# Patient Record
Sex: Male | Born: 1940 | Race: White | Hispanic: No | Marital: Married | Smoking: Never smoker
Health system: Southern US, Community
[De-identification: ages and names within clinical notes are randomized; demographics above are authoritative.]

## PROBLEM LIST (undated history)

## (undated) DIAGNOSIS — I10 Essential (primary) hypertension: Secondary | ICD-10-CM

## (undated) DIAGNOSIS — F0390 Unspecified dementia without behavioral disturbance: Secondary | ICD-10-CM

## (undated) HISTORY — PX: OTHER SURGICAL HISTORY: SHX169

---

## 2005-08-05 ENCOUNTER — Encounter: Payer: Self-pay | Admitting: Internal Medicine

## 2005-08-25 ENCOUNTER — Encounter: Payer: Self-pay | Admitting: Internal Medicine

## 2005-09-03 ENCOUNTER — Ambulatory Visit: Payer: Self-pay | Admitting: Internal Medicine

## 2005-09-18 ENCOUNTER — Encounter: Payer: Self-pay | Admitting: Internal Medicine

## 2005-10-06 ENCOUNTER — Ambulatory Visit: Payer: Self-pay | Admitting: Pain Medicine

## 2006-09-22 ENCOUNTER — Ambulatory Visit: Payer: Self-pay | Admitting: Internal Medicine

## 2007-02-03 ENCOUNTER — Ambulatory Visit: Payer: Self-pay | Admitting: Unknown Physician Specialty

## 2007-06-18 ENCOUNTER — Ambulatory Visit (HOSPITAL_COMMUNITY): Admission: RE | Admit: 2007-06-18 | Discharge: 2007-06-18 | Payer: Self-pay | Admitting: Neurology

## 2007-08-09 ENCOUNTER — Ambulatory Visit: Payer: Self-pay | Admitting: Psychology

## 2007-09-16 ENCOUNTER — Encounter: Payer: Self-pay | Admitting: Neurology

## 2007-09-19 ENCOUNTER — Encounter: Payer: Self-pay | Admitting: Neurology

## 2007-10-20 ENCOUNTER — Encounter: Payer: Self-pay | Admitting: Neurology

## 2009-05-01 ENCOUNTER — Ambulatory Visit: Payer: Self-pay | Admitting: General Practice

## 2009-06-29 ENCOUNTER — Ambulatory Visit: Payer: Self-pay | Admitting: Internal Medicine

## 2009-06-29 ENCOUNTER — Ambulatory Visit: Payer: Self-pay | Admitting: General Practice

## 2009-07-16 ENCOUNTER — Ambulatory Visit: Payer: Self-pay | Admitting: General Practice

## 2012-01-06 ENCOUNTER — Ambulatory Visit: Payer: Self-pay | Admitting: Cardiology

## 2012-01-07 LAB — BASIC METABOLIC PANEL
Calcium, Total: 8.7 mg/dL (ref 8.5–10.1)
Chloride: 107 mmol/L (ref 98–107)
EGFR (African American): >60
Glucose: 96 mg/dL (ref 65–99)
Sodium: 141 mmol/L (ref 136–145)

## 2012-01-07 LAB — CK TOTAL AND CKMB (NOT AT ARMC)
CK, Total: 64 U/L (ref 35–232)
CK-MB: 0.8 ng/mL (ref 0.5–3.6)

## 2014-11-12 NOTE — Discharge Summary (Signed)
PATIENT NAME:  Frank Lee, Frank P MR#:  [id number] DATE OF BIRTH:  1941/04/07  DATE OF ADMISSION:  01/06/2012 DATE OF DISCHARGE:  01/07/2012  PRIMARY CARE PHYSICIAN:  Dr. Bethann PunchesMark Miller  FINAL DIAGNOSES: 1. Coronary artery disease.  2. Hypertension.  3. Hyperlipidemia.  4. Dementia.   MEDICATIONS ON ADMISSION:  1. Aspirin 81 mg daily.  2. Clopidogrel  75 mg daily.  3. Amlodipine 10 mg daily.  4. Benazepril/ hydrochlorothiazide 20/12.5 mg daily. 5. Mirtazapine 30 mg at bedtime.  6. Naprosyn 250 mg daily.  7. Lovaza 1 gram q.i.d.   PROCEDURES:  1. Cardiac catheterization with selective coronary arteriography on 01/06/2012.  2. Percutaneous coronary intervention with coronary stent on 01/06/2012.   HISTORY OF PRESENT ILLNESS: Please see admission History and Physical.   HOSPITAL COURSE: The patient underwent elective cardiac catheterization on 01/06/2012. Coronary arteriography revealed a high-grade 90% stenosis proximal LAD, 50% stenosis LAD, occluded distal LAD. There was sequential high-grade stenosis with a 90% stenosis mid and 85% stenosis distal RCA. The patient underwent percutaneous coronary intervention receiving a 3 x 12-mm drug-eluting Promise stent in the mid RCA and a 2.75 x 12-mm drug-eluting Promise stent in the distal RCA. The patient was returned to the Critical Care Unit where he had an uncomplicated hospital course. Postprocedural CPK and  MB were 64 and 0.8 respectively.    On the morning of 01/07/2012 the patient was discharged home in stable condition. He is scheduled to see me in follow-up in one week.   ____________________________ Marcina MillardAlexander Nela Bascom, MD ap:bjt D: 01/07/2012 07:55:36 ET T: 01/07/2012 11:05:58 ET JOB#: [id number]  cc: Marcina MillardAlexander Kaity Pitstick, MD, <Dictator> Marcina MillardALEXANDER Christophor Eick MD ELECTRONICALLY SIGNED 01/29/2012 8:34

## 2015-06-08 ENCOUNTER — Ambulatory Visit
Admission: EM | Admit: 2015-06-08 | Discharge: 2015-06-08 | Disposition: A | Discharge status: Home / Self Care | Payer: Medicare Other | Attending: Family Medicine | Admitting: Family Medicine

## 2015-06-08 DIAGNOSIS — S0181XA Laceration without foreign body of other part of head, initial encounter: Secondary | ICD-10-CM

## 2015-06-08 DIAGNOSIS — W19XXXA Unspecified fall, initial encounter: Secondary | ICD-10-CM

## 2015-06-08 HISTORY — DX: Essential (primary) hypertension: I10

## 2015-06-08 HISTORY — DX: Unspecified dementia, unspecified severity, without behavioral disturbance, psychotic disturbance, mood disturbance, and anxiety: F03.90

## 2015-06-08 NOTE — ED Notes (Signed)
Lost balance while walking this morning at 9am and hit head on pavement. Has laceration above right eye. Hx of dementia and non verbal

## 2015-06-10 NOTE — ED Notes (Signed)
Pt's wife called, no prescription for amoxicillin was called in that she says Dr. Judd Gaudieronty said would be prescribed by verbal discharge. Pt did not receive paper d/c. Dr. Allena KatzPatel notified, no note by Dr. Judd Gaudieronty in computer and no script for amoxicillin noted.  Pt's wife asked and said no signs of infection on laceration on pt's face. Per Dr. Allena KatzPatel usually don't prescribe amoxicillin for cuts to face and wife notified. Suggested if they are still concerned they can reach Dr. Judd Gaudieronty here tomorrow.  Pt's wife just wanted note put in file.

## 2015-07-12 NOTE — ED Provider Notes (Signed)
CSN: [id number]     Arrival date & time 06/08/15  1002 History   First MD Initiated Contact with Patient 06/08/15 1125     Chief Complaint  Patient presents with  . Laceration   (Consider location/radiation/quality/duration/timing/severity/associated sxs/prior Treatment) Patient is a 74 y.o. male presenting with skin laceration. The history is provided by the spouse.  Laceration Location:  Face Facial laceration location:  R eyebrow and forehead Length (cm):  1.5 Depth:  Cutaneous Quality: straight   Bleeding: controlled   Time since incident:  2 hours Laceration mechanism:  Fall Foreign body present:  No foreign bodies   Past Medical History  Diagnosis Date  . Hypertension   . Dementia    Past Surgical History  Procedure Laterality Date  . Cardiac stents     Family History  Problem Relation Age of Onset  . Stroke Mother   . Cancer Father    Social History  Substance Use Topics  . Smoking status: Never Smoker   . Smokeless tobacco: None  . Alcohol Use: No    Review of Systems  Allergies  Review of patient's allergies indicates not on file.  Home Medications   Prior to Admission medications   Medication Sig Start Date End Date Taking? Authorizing Provider  amLODipine (NORVASC) 10 MG tablet Take 30 mg by mouth daily.   Yes Historical Provider, MD  aspirin EC 325 MG tablet Take 325 mg by mouth daily.   Yes Historical Provider, MD  hydrochlorothiazide (MICROZIDE) 12.5 MG capsule Take 12.5 mg by mouth daily.   Yes Historical Provider, MD  mirtazapine (REMERON) 15 MG tablet Take 15 mg by mouth at bedtime.   Yes Historical Provider, MD  naproxen (NAPROSYN) 250 MG tablet Take by mouth 2 (two) times daily with a meal.   Yes Historical Provider, MD   Meds Ordered and Administered this Visit  Medications - No data to display  BP 159/85 mmHg  Pulse 64  Temp(Src) 96.6 F (35.9 C) (Tympanic)  Resp 17  Ht 5\' 9"  (1.753 m)  Wt 163 lb (73.936 kg)  BMI 24.06 kg/m2   SpO2 97% No data found.   Physical Exam  Eyes: Conjunctivae and EOM are normal. Pupils are equal, round, and reactive to light. Right eye exhibits no discharge. Left eye exhibits no discharge.  Neck: Normal range of motion. Neck supple. No tracheal deviation present. No thyromegaly present.  Pulmonary/Chest: No stridor.  Lymphadenopathy:    He has no cervical adenopathy.  Neurological: He is alert. He displays normal reflexes. No cranial nerve deficit. He exhibits normal muscle tone. Coordination normal.  Skin:  1.5 cm on forehead, just above right eyebrow  Nursing note and vitals reviewed.   ED Course  Procedures (including critical care time)  Labs Review Labs Reviewed - No data to display  Imaging Review No results found.   Visual Acuity Review  Right Eye Distance:   Left Eye Distance:   Bilateral Distance:    Right Eye Near:   Left Eye Near:    Bilateral Near:         MDM   1. Laceration of forehead, initial encounter   2. Fall, initial encounter     Discharge Medication List as of 06/08/2015 12:49 PM     1. diagnosis reviewed with spouse; wound area cleaned, prepped in sterile fashion and anesthetized with 1% lidocaine; laceration closed with 3 interrupted sutures placed with 5.0 Nylon; patient tolerated well.  2. Recommend supportive treatment with wound care,  otc analgesics 4. Follow-up with PCP or sooner prn if symptoms worsen or don't improve; otherwise 7-10 days for suture removal    Payton Mccallum, MD 07/12/15 [id number]

## 2015-08-30 ENCOUNTER — Emergency Department: Payer: Medicare Other

## 2015-08-30 ENCOUNTER — Encounter: Payer: Self-pay | Admitting: Emergency Medicine

## 2015-08-30 ENCOUNTER — Inpatient Hospital Stay
Admission: EM | Admit: 2015-08-30 | Discharge: 2015-09-05 | DRG: 177 | Disposition: A | Discharge status: Home Health | Payer: Medicare Other | Attending: Internal Medicine | Admitting: Internal Medicine

## 2015-08-30 ENCOUNTER — Inpatient Hospital Stay: Payer: Medicare Other

## 2015-08-30 DIAGNOSIS — F0390 Unspecified dementia without behavioral disturbance: Secondary | ICD-10-CM | POA: Diagnosis present

## 2015-08-30 DIAGNOSIS — Z79899 Other long term (current) drug therapy: Secondary | ICD-10-CM

## 2015-08-30 DIAGNOSIS — D72829 Elevated white blood cell count, unspecified: Secondary | ICD-10-CM

## 2015-08-30 DIAGNOSIS — Z823 Family history of stroke: Secondary | ICD-10-CM | POA: Diagnosis not present

## 2015-08-30 DIAGNOSIS — T17908A Unspecified foreign body in respiratory tract, part unspecified causing other injury, initial encounter: Secondary | ICD-10-CM

## 2015-08-30 DIAGNOSIS — R531 Weakness: Secondary | ICD-10-CM

## 2015-08-30 DIAGNOSIS — E876 Hypokalemia: Secondary | ICD-10-CM | POA: Diagnosis present

## 2015-08-30 DIAGNOSIS — Z66 Do not resuscitate: Secondary | ICD-10-CM | POA: Diagnosis present

## 2015-08-30 DIAGNOSIS — I2510 Atherosclerotic heart disease of native coronary artery without angina pectoris: Secondary | ICD-10-CM | POA: Diagnosis present

## 2015-08-30 DIAGNOSIS — Z7982 Long term (current) use of aspirin: Secondary | ICD-10-CM

## 2015-08-30 DIAGNOSIS — J189 Pneumonia, unspecified organism: Secondary | ICD-10-CM

## 2015-08-30 DIAGNOSIS — J690 Pneumonitis due to inhalation of food and vomit: Secondary | ICD-10-CM | POA: Diagnosis present

## 2015-08-30 DIAGNOSIS — Z955 Presence of coronary angioplasty implant and graft: Secondary | ICD-10-CM

## 2015-08-30 DIAGNOSIS — R1310 Dysphagia, unspecified: Secondary | ICD-10-CM | POA: Diagnosis present

## 2015-08-30 DIAGNOSIS — R4701 Aphasia: Secondary | ICD-10-CM | POA: Diagnosis present

## 2015-08-30 DIAGNOSIS — I10 Essential (primary) hypertension: Secondary | ICD-10-CM | POA: Diagnosis present

## 2015-08-30 DIAGNOSIS — R739 Hyperglycemia, unspecified: Secondary | ICD-10-CM

## 2015-08-30 DIAGNOSIS — J9601 Acute respiratory failure with hypoxia: Secondary | ICD-10-CM | POA: Diagnosis present

## 2015-08-30 LAB — CBC WITH DIFFERENTIAL/PLATELET
BASOS PCT: 0 %
Basophils Absolute: 0.1 10*3/uL (ref 0–0.1)
Eosinophils Absolute: 0 10*3/uL (ref 0–0.7)
Eosinophils Relative: 0 %
HEMATOCRIT: 49.8 % (ref 40.0–52.0)
HEMOGLOBIN: 16.7 g/dL (ref 13.0–18.0)
LYMPHS ABS: 2 10*3/uL (ref 1.0–3.6)
LYMPHS PCT: 12 %
MCH: 30.3 pg (ref 26.0–34.0)
MCHC: 33.5 g/dL (ref 32.0–36.0)
MCV: 90.4 fL (ref 80.0–100.0)
MONO ABS: 0.8 10*3/uL (ref 0.2–1.0)
MONOS PCT: 4 %
NEUTROS ABS: 14.4 10*3/uL — AB (ref 1.4–6.5)
Neutrophils Relative %: 84 %
Platelets: 212 10*3/uL (ref 150–440)
RBC: 5.51 MIL/uL (ref 4.40–5.90)
RDW: 14.3 % (ref 11.5–14.5)
WBC: 17.3 10*3/uL — ABNORMAL HIGH (ref 3.8–10.6)

## 2015-08-30 LAB — BLOOD GAS, ARTERIAL
ALLENS TEST (PASS/FAIL): POSITIVE — AB
Acid-base deficit: 1.3 mmol/L (ref 0.0–2.0)
Bicarbonate: 21.7 mEq/L (ref 21.0–28.0)
FIO2: 0.4
O2 SAT: 88.4 %
PCO2 ART: 32 mmHg (ref 32.0–48.0)
PH ART: 7.44 (ref 7.350–7.450)
Patient temperature: 37
pO2, Arterial: 53 mmHg — ABNORMAL LOW (ref 83.0–108.0)

## 2015-08-30 LAB — BASIC METABOLIC PANEL
Anion gap: 15 (ref 5–15)
BUN: 12 mg/dL (ref 6–20)
CALCIUM: 10 mg/dL (ref 8.9–10.3)
CHLORIDE: 104 mmol/L (ref 101–111)
CO2: 23 mmol/L (ref 22–32)
CREATININE: 0.79 mg/dL (ref 0.61–1.24)
GFR calc Af Amer: >60 mL/min (ref >60)
GFR calc non Af Amer: >60 mL/min (ref >60)
GLUCOSE: 226 mg/dL — AB (ref 65–99)
Potassium: 3.4 mmol/L — ABNORMAL LOW (ref 3.5–5.1)
Sodium: 142 mmol/L (ref 135–145)

## 2015-08-30 MED ORDER — ALBUTEROL SULFATE (2.5 MG/3ML) 0.083% IN NEBU
2.5000 mg | INHALATION_SOLUTION | Freq: Four times a day (QID) | RESPIRATORY_TRACT | Status: DC
  Administered 2015-08-30 – 2015-09-02 (×9): 2.5 mg via RESPIRATORY_TRACT
  Filled 2015-08-30 (×11): qty 3

## 2015-08-30 MED ORDER — NITROGLYCERIN 2 % TD OINT
0.5000 [in_us] | TOPICAL_OINTMENT | Freq: Four times a day (QID) | TRANSDERMAL | Status: DC
Start: 2015-08-30 — End: 2015-09-05
  Administered 2015-08-30 – 2015-09-05 (×24): 0.5 [in_us] via TOPICAL
  Filled 2015-08-30 (×23): qty 1

## 2015-08-30 MED ORDER — KCL IN DEXTROSE-NACL 20-5-0.9 MEQ/L-%-% IV SOLN
INTRAVENOUS | Status: DC
  Administered 2015-08-30 – 2015-09-01 (×4): via INTRAVENOUS
  Filled 2015-08-30 (×6): qty 1000

## 2015-08-30 MED ORDER — SODIUM CHLORIDE 0.9 % IV SOLN
3.0000 g | Freq: Three times a day (TID) | INTRAVENOUS | Status: DC
  Administered 2015-08-30 – 2015-09-05 (×18): 3 g via INTRAVENOUS
  Filled 2015-08-30 (×21): qty 3

## 2015-08-30 MED ORDER — ASPIRIN 300 MG RE SUPP
300.0000 mg | Freq: Every day | RECTAL | Status: DC
  Administered 2015-08-31: 300 mg via RECTAL
  Filled 2015-08-30: qty 1

## 2015-08-30 MED ORDER — SODIUM CHLORIDE 0.9 % IV SOLN
3.0000 g | Freq: Once | INTRAVENOUS | Status: AC
  Administered 2015-08-30: 3 g via INTRAVENOUS
  Filled 2015-08-30: qty 3

## 2015-08-30 MED ORDER — LORAZEPAM 2 MG/ML IJ SOLN
0.5000 mg | Freq: Two times a day (BID) | INTRAMUSCULAR | Status: DC | PRN
  Administered 2015-08-30 – 2015-08-31 (×3): 0.5 mg via INTRAVENOUS
  Filled 2015-08-30 (×3): qty 1

## 2015-08-30 MED ORDER — ENOXAPARIN SODIUM 40 MG/0.4ML ~~LOC~~ SOLN
40.0000 mg | SUBCUTANEOUS | Status: DC
  Administered 2015-08-30 – 2015-09-04 (×6): 40 mg via SUBCUTANEOUS
  Filled 2015-08-30 (×6): qty 0.4

## 2015-08-30 MED ORDER — SODIUM CHLORIDE 0.9% FLUSH
3.0000 mL | Freq: Two times a day (BID) | INTRAVENOUS | Status: DC
  Administered 2015-08-30 – 2015-09-04 (×9): 3 mL via INTRAVENOUS

## 2015-08-30 MED ORDER — ONDANSETRON HCL 4 MG PO TABS: 4.0000 mg | ORAL_TABLET | Freq: Four times a day (QID) | ORAL | Status: DC | PRN

## 2015-08-30 MED ORDER — ONDANSETRON HCL 4 MG/2ML IJ SOLN: 4.0000 mg | Freq: Four times a day (QID) | INTRAMUSCULAR | Status: DC | PRN

## 2015-08-30 NOTE — ED Notes (Addendum)
Pt in from triage; pt wife reports pt having lunch about 3 hours ago and suddenly getting up from the table and went to bathroom; at that time pt begins coughing, foaming at the mouth.  Pt denies any choking at the table while eating lunch.  Pt with advanced dementia; nonverbal, unable to follow commands.  Pt hypertensive, tachycardic, 96% on 3L nasal cannula.  Pt presents with gurgling and frothy, foam-like sputum at the mouth.  MD at bedside.

## 2015-08-30 NOTE — ED Provider Notes (Signed)
[facility] Emergency Department Provider Note   ____________________________________________  Time seen: ~1345  I have reviewed the triage vital signs and the nursing notes.   HISTORY  Chief Complaint Aspiration   History limited by: Dementia, non verbal, hx obtained from family   HPI Frank Lee is a 75 y.o. male with history of dementia, non verbal who presents to the emergency department today because of aspiration. The patient was eating lunch roughly 3 hours ago when family noticed he started having difficulty with breathing and started coughing. The wife did not notice any distinct choking episode. The symptoms have been constant. The wife has not appreciated that the patient has been in any pain although the patient is nonverbal.    Past Medical History  Diagnosis Date  . Hypertension   . Dementia     There are no active problems to display for this patient.   Past Surgical History  Procedure Laterality Date  . Cardiac stents      Current Outpatient Rx  Name  Route  Sig  Dispense  Refill  . amLODipine (NORVASC) 10 MG tablet   Oral   Take 30 mg by mouth daily.         Marland Kitchen aspirin EC 325 MG tablet   Oral   Take 325 mg by mouth daily.         . hydrochlorothiazide (MICROZIDE) 12.5 MG capsule   Oral   Take 12.5 mg by mouth daily.         . mirtazapine (REMERON) 15 MG tablet   Oral   Take 15 mg by mouth at bedtime.         . naproxen (NAPROSYN) 250 MG tablet   Oral   Take by mouth 2 (two) times daily with a meal.           Allergies Review of patient's allergies indicates no known allergies.  Family History  Problem Relation Age of Onset  . Stroke Mother   . Cancer Father     Social History Social History  Substance Use Topics  . Smoking status: Never Smoker   . Smokeless tobacco: None  . Alcohol Use: No    Review of Systems Unable to obtain given the patient  nonverbal  ____________________________________________   PHYSICAL EXAM:  VITAL SIGNS: ED Triage Vitals  Enc Vitals Group     BP 08/30/15 1331 157/101 mmHg     Pulse Rate 08/30/15 1331 109     Resp 08/30/15 1331 24     Temp --      Temp src --    Constitutional: Awake and alert. Nonverbal. Mild respiratory distress. Eyes: Conjunctivae are normal. PERRL. Normal extraocular movements. ENT   Head: Normocephalic and atraumatic.   Nose: No congestion/rhinnorhea.   Mouth/Throat: Mucous membranes are moist.   Neck: No stridor. Hematological/Lymphatic/Immunilogical: No cervical lymphadenopathy. Cardiovascular: Tachycardic, regular rhythm.  No murmurs, rubs, or gallops. Respiratory: Tachypneic, mild respiratory distress. Gastrointestinal: Soft and nontender. No distention. Genitourinary: Deferred Musculoskeletal: Normal range of motion in all extremities. No joint effusions.  No lower extremity tenderness nor edema. Neurologic:  Normal speech and language. No gross focal neurologic deficits are appreciated.  Skin:  Skin is warm, dry and intact. No rash noted. Psychiatric: Mood and affect are normal. Speech and behavior are normal. Patient exhibits appropriate insight and judgment.  ____________________________________________    LABS (pertinent positives/negatives)  Labs Reviewed  CBC WITH DIFFERENTIAL/PLATELET - Abnormal; Notable for the following:  WBC 17.3 (*)    Neutro Abs 14.4 (*)    All other components within normal limits  BASIC METABOLIC PANEL - Abnormal; Notable for the following:    Potassium 3.4 (*)    Glucose, Bld 226 (*)    All other components within normal limits     ____________________________________________   EKG  I,Phineas Semendman, attending physician, personally viewed and interpreted this EKG  EKG Time: 1553 Rate: 117 Rhythm: sinus tachycardia Axis: left axis deviation Intervals: qtc 451 QRS: narrow, LAFB ST changes: no st  elevation Impression: abnormal ekg   ____________________________________________    RADIOLOGY  CXR  IMPRESSION: Increased density over the lower thoracic spine on the lateral view could be due to an overlap of vascular and osseous structures. Early sequela of aspiration cannot be excluded. Radiographic follow up recommended.  ____________________________________________   PROCEDURES  Procedure(s) performed: None  Critical Care performed: No  ____________________________________________   INITIAL IMPRESSION / ASSESSMENT AND PLAN / ED COURSE  Pertinent labs & imaging results that were available during my care of the patient were reviewed by me and considered in my medical decision making (see chart for details).  Patient presented to the emergency department today because of concerns for an aspiration event. On exam patient was in mild respiratory distress. Patient's oxygen saturation stayed improved after being placed on supplemental oxygen. Chest x-ray does show some opacities consistent with possible aspiration. Will plan on covering patient for possible pneumonia with Unasyn and admission to hospital service.  ____________________________________________   FINAL CLINICAL IMPRESSION(S) / ED DIAGNOSES  Final diagnoses:  Aspiration pneumonitis (HCC)  Aspiration into airway     Phineas Semen, MD 08/30/15 9604

## 2015-08-30 NOTE — ED Notes (Signed)
Attempted to call report; RN busy at this time and unable to take report.

## 2015-08-30 NOTE — ED Notes (Signed)
Patient transported to X-ray 

## 2015-08-30 NOTE — Progress Notes (Signed)
Skin verified on admission by [place]

## 2015-08-30 NOTE — ED Notes (Signed)
Wife reports pt with possible choking episode while eating, reports pt has been "gurgling" since then. Pt with advanced dementia, unable to talk.

## 2015-08-30 NOTE — ED Notes (Signed)
Patient transported to CT 

## 2015-08-30 NOTE — ED Notes (Signed)
Pt with coughing episode; coughed up medium amount of yellow, frothy sputum.  MD made aware of pt condition at this time.

## 2015-08-30 NOTE — H&P (Signed)
[facility] [place] [facility]   PATIENT NAME: Frank Lee    MR#:  [id number]  DATE OF BIRTH:  [birth year]  DATE OF ADMISSION:  08/30/2015  PRIMARY CARE PHYSICIAN: No primary care provider on file.   REQUESTING/REFERRING PHYSICIAN:   CHIEF COMPLAINT:   Chief Complaint  Patient presents with  . Aspiration    HISTORY OF PRESENT ILLNESS: Frank Lee  is a 75 y.o. male with a known history of essential hypertension, carotid artery disease, status post 2 stent placement, dementia, presented to the hospital with complaints of aspiration. Apparently patient was eating lunch some steak with gravy" and rice and started foaming and coughing. Patient's sister did not notice any Hx of food coughed up, but patient was severely uncomfortable and he was brought to emergency room for further evaluation where he was noted to be hypoxic and now requires 5 L of oxygen through nasal cannulas and his O2 sats ranging between 88-90%. Hospitalist services were contacted for admission. Patient is not able to give a review of systems since he is nonverbal due to advanced dementia.  Patient's labs revealed leukocytosis. Chest x-ray showed increased density over the lower thoracic spine, unknown if right or left side.   PAST MEDICAL HISTORY:   Past Medical History  Diagnosis Date  . Hypertension   . Dementia     PAST SURGICAL HISTORY: Past Surgical History  Procedure Laterality Date  . Cardiac stents      SOCIAL HISTORY:  Social History  Substance Use Topics  . Smoking status: Never Smoker   . Smokeless tobacco: Not on file  . Alcohol Use: No    FAMILY HISTORY:  Family History  Problem Relation Age of Onset  . Stroke Mother   . Cancer Father     DRUG ALLERGIES: No Known Allergies  Review of Systems  Unable to perform ROS: dementia    MEDICATIONS AT HOME:  Prior to Admission medications   Medication Sig Start Date End Date Taking? Authorizing Provider   amLODipine (NORVASC) 10 MG tablet Take 30 mg by mouth daily.    Historical Provider, MD  aspirin EC 325 MG tablet Take 325 mg by mouth daily.    Historical Provider, MD  hydrochlorothiazide (MICROZIDE) 12.5 MG capsule Take 12.5 mg by mouth daily.    Historical Provider, MD  mirtazapine (REMERON) 15 MG tablet Take 15 mg by mouth at bedtime.    Historical Provider, MD  naproxen (NAPROSYN) 250 MG tablet Take by mouth 2 (two) times daily with a meal.    Historical Provider, MD      PHYSICAL EXAMINATION:   VITAL SIGNS: Blood pressure 173/105, pulse 151, resp. rate 25, SpO2 88 %.  GENERAL:  75 y.o.-year-old patient lying in the bed in moderate respiratory distress, coughs intermittently, but then becomes somewhat somnolent, flushed and face.  EYES: Pupils equal, round, reactive to light and accommodation. No scleral icterus. Extraocular muscles intact.  HEENT: Head atraumatic, normocephalic. Oropharynx and nasopharynx clear.  NECK:  Supple, no jugular venous distention. No thyroid enlargement, no tenderness.  LUNGS: Normal breath sounds bilaterally, no wheezing, , but bilateral rales,rhonchi  And crepitations, diminished breath sounds bilaterally at bases.   Using accessory  muscles of respiration.  CARDIOVASCULAR: S1, S2, tachycardic, regular . No murmurs, rubs, or gallops.  ABDOMEN: Soft, nontender, nondistended. Bowel sounds present. No organomegaly or mass.  EXTREMITIES: No pedal edema, cyanosis, or clubbing.  NEUROLOGIC: Cranial nerves II through XII are grossly  intact. Muscle  strength 5/5 in all extremities, but not able to follow commands. Sensatiounable to evaluate . Gait not checked.  PSYCHIATRIC: The patient isomnolent not able to assess his orientation due to severe advanced dementia .  SKIN: No obvious rash, lesion, or ulcer.   LABORATORY PANEL:   CBC  Recent Labs Lab 08/30/15 1429  WBC 17.3*  HGB 16.7  HCT 49.8  PLT 212  MCV 90.4  MCH 30.3  MCHC 33.5  RDW 14.3   LYMPHSABS 2.0  MONOABS 0.8  EOSABS 0.0  BASOSABS 0.1   ------------------------------------------------------------------------------------------------------------------  Chemistries   Recent Labs Lab 08/30/15 1429  NA 142  K 3.4*  CL 104  CO2 23  GLUCOSE 226*  BUN 12  CREATININE 0.79  CALCIUM 10.0   ------------------------------------------------------------------------------------------------------------------  Cardiac Enzymes No results for input(s): TROPONINI in the last 168 hours. ------------------------------------------------------------------------------------------------------------------  RADIOLOGY: Dg Chest 2 View  08/30/2015  CLINICAL DATA:  Coughing after eating earlier today. History of dementia and hypertension. EXAM: CHEST  2 VIEW COMPARISON:  None. FINDINGS: Patient is mildly rotated on both views and there is suboptimal inspiration. The heart size and mediastinal contours are unremarkable for age. There is mild aortic tortuosity. Increased density is noted over the lower thoracic spine on the lateral view, without definite corresponding finding on the frontal examination. There is no pleural effusion or pneumothorax. The bones appear unremarkable. IMPRESSION: Increased density over the lower thoracic spine on the lateral view could be due to an overlap of vascular and osseous structures. Early sequela of aspiration cannot be excluded. Radiographic follow up recommended. Electronically Signed   By: Carey Bullocks M.D.   On: 08/30/2015 14:19    EKG: Orders placed or performed during the hospital encounter of 08/30/15  . ED EKG  . ED EKG  . EKG 12-Lead  . EKG 12-Lead    IMPRESSION AND PLAN:  Principal Problem:   Acute respiratory failure with hypoxia (HCC) Active Problems:   Aspiration pneumonitis (HCC)   Leukocytosis   Hypokalemia   Hyperglycemia #1. Acute  respiratory failure with hypoxia due to aspiration, in addition medical floor. Continue  oxygen therapy to keep pulse oximeter at around 92-94%, get ABGs #2. Aspiration pneumonitis , get CT of the chest and pulmonary consultation , initiate patient on Unasyn   #3. Leukocytosis due to stress and aspiration pneumonitis, follow with antibiotic therapy #4. Hypokalemia, supplemented intravenously #5. Dysphagia, keep patient nothing by mouth get speech therapist involved for further recommendations #6. Hyperglycemia. Get hemoglobin A1c #7. Essential hypertension, which is much more pronounced in emergency room with systolic blood pressure around 170, keep patient nothing by mouth and hold his blood pressure medications, initiate patient on nitroglycerin topically #8. History of coronary artery disease. Continue patient on aspirin therapy rectally   All the records are reviewed and case discussed with ED provider. Management plans discussed with the patient, family and they are in agreement.  CODE STATUS: Code Status History    This patient does not have a recorded code status. Please follow your organizational policy for patients in this situation.    Advance Directive Documentation        Most Recent Value   Type of Advance Directive  Healthcare Power of Attorney   Pre-existing out of facility DNR order (yellow form or pink MOST form)     "MOST" Form in Place?         TOTAL  critical care TIME TAKING CARE OF THIS PATIENT: 60 minutes.    Jaynie [email or web address].D  on 08/30/2015 at 5:08 PM  Between 7am to 6pm - Pager - [phone] After 6pm go to [email or web address] - password [email or web address] [facility]  [facility]  Office  [phone]  CC: Primary care physician; No primary care provider on file.

## 2015-08-31 ENCOUNTER — Inpatient Hospital Stay: Payer: Medicare Other

## 2015-08-31 LAB — BASIC METABOLIC PANEL
ANION GAP: 14 (ref 5–15)
BUN: 12 mg/dL (ref 6–20)
CALCIUM: 9.4 mg/dL (ref 8.9–10.3)
CO2: 21 mmol/L — AB (ref 22–32)
Chloride: 109 mmol/L (ref 101–111)
Creatinine, Ser: 0.86 mg/dL (ref 0.61–1.24)
GFR calc Af Amer: >60 mL/min (ref >60)
GFR calc non Af Amer: >60 mL/min (ref >60)
GLUCOSE: 248 mg/dL — AB (ref 65–99)
Potassium: 3.3 mmol/L — ABNORMAL LOW (ref 3.5–5.1)
Sodium: 144 mmol/L (ref 135–145)

## 2015-08-31 LAB — CBC
HCT: 42.2 % (ref 40.0–52.0)
HEMOGLOBIN: 14.7 g/dL (ref 13.0–18.0)
MCH: 30.8 pg (ref 26.0–34.0)
MCHC: 34.9 g/dL (ref 32.0–36.0)
MCV: 88.2 fL (ref 80.0–100.0)
Platelets: 185 10*3/uL (ref 150–440)
RBC: 4.78 MIL/uL (ref 4.40–5.90)
RDW: 13.8 % (ref 11.5–14.5)
WBC: 16.2 10*3/uL — ABNORMAL HIGH (ref 3.8–10.6)

## 2015-08-31 LAB — GLUCOSE, CAPILLARY
GLUCOSE-CAPILLARY: 181 mg/dL — AB (ref 65–99)
Glucose-Capillary: 147 mg/dL — ABNORMAL HIGH (ref 65–99)

## 2015-08-31 LAB — HEMOGLOBIN A1C: Hgb A1c MFr Bld: 7.5 % — ABNORMAL HIGH (ref 4.0–6.0)

## 2015-08-31 MED ORDER — INSULIN ASPART 100 UNIT/ML ~~LOC~~ SOLN: 0.0000 [IU] | Freq: Every day | SUBCUTANEOUS | Status: DC

## 2015-08-31 MED ORDER — ACETAMINOPHEN 650 MG RE SUPP
650.0000 mg | RECTAL | Status: DC | PRN
  Administered 2015-08-31 – 2015-09-04 (×4): 650 mg via RECTAL
  Filled 2015-08-31 (×4): qty 1

## 2015-08-31 MED ORDER — INSULIN ASPART 100 UNIT/ML ~~LOC~~ SOLN
0.0000 [IU] | Freq: Three times a day (TID) | SUBCUTANEOUS | Status: DC
  Administered 2015-08-31: 2 [IU] via SUBCUTANEOUS
  Administered 2015-09-01: 1 [IU] via SUBCUTANEOUS
  Administered 2015-09-01 – 2015-09-03 (×6): 2 [IU] via SUBCUTANEOUS
  Administered 2015-09-03: 1 [IU] via SUBCUTANEOUS
  Administered 2015-09-03 – 2015-09-05 (×6): 2 [IU] via SUBCUTANEOUS
  Filled 2015-08-31 (×5): qty 2
  Filled 2015-08-31: qty 1
  Filled 2015-08-31: qty 6
  Filled 2015-08-31: qty 1
  Filled 2015-08-31: qty 2
  Filled 2015-08-31: qty 1
  Filled 2015-08-31 (×3): qty 2

## 2015-08-31 NOTE — Progress Notes (Signed)
Inpatient Diabetes Program Recommendations  AACE/ADA: New Consensus Statement on Inpatient Glycemic Control (2015)  Target Ranges:  Prepandial:   less than 140 mg/dL      Peak postprandial:   less than 180 mg/dL (1-2 hours)      Critically ill patients:  140 - 180 mg/dL   Review of Glycemic Control  Results for CHANE, COWDEN (MRN [id number]) as of 08/31/2015 13:17  Ref. Range 08/30/2015 14:29  Hemoglobin A1C Latest Ref Range: 4.0-6.0 % 7.5 (H)   Results for ELIGH, RYBACKI (MRN [id number]) as of 08/31/2015 13:17  Ref. Range 01/07/2012 01:17 08/30/2015 14:29 08/30/2015 17:20 08/31/2015 05:16  Glucose Latest Ref Range: 65-99 mg/dL 96 782 (H)  956 (H)    Diabetes history: none documented Outpatient Diabetes medications: none Current orders for Inpatient glycemic control: none  Inpatient Diabetes Program Recommendations:  elevated lab glucose, A1C 7.5%  Please consider ordering CBG tid and hs and  Novolog 0-9 units tid and Novolog 0-5 units qhs  Susette Racer, RN, [place], [place], CDE Diabetes Coordinator Inpatient Diabetes Program  [phone] (Team Pager) [phone] [facility] Office) 08/31/2015 1:20 PM

## 2015-08-31 NOTE — Progress Notes (Signed)
[facility] -  at [facility]   PATIENT NAME: Frank Lee    MR#:  [id number]  DATE OF BIRTH:  1940/09/15  SUBJECTIVE:  CHIEF COMPLAINT:   Chief Complaint  Patient presents with  . Aspiration   - Patient is nonverbal, admitted after an aspiration/choking episode - breathing better per family, currently on 3 L nasal cannula.  -Doesn't have any swallowing difficulty usually.  REVIEW OF SYSTEMS:  Review of Systems  Unable to perform ROS: patient nonverbal    DRUG ALLERGIES:  No Known Allergies  VITALS:  Blood pressure 150/93, pulse 106, temperature 98.6 F (37 C), temperature source Axillary, resp. rate 18, height  (1.778 m), weight 77.61 kg (171 lb 1.6 oz), SpO2 96 %.  PHYSICAL EXAMINATION:  Physical Exam  GENERAL:  75 y.o.-year-old patient lying in the bed with no acute distress.  EYES: Pupils equal, round, reactive to light and accommodation. No scleral icterus. Extraocular muscles intact.  HEENT: Head atraumatic, normocephalic. Oropharynx and nasopharynx clear.  NECK:  Supple, no jugular venous distention. No thyroid enlargement, no tenderness.  LUNGS: Normal breath sounds bilaterally, no wheezing, rales or crepitation. No use of accessory muscles of respiration. Basilar coarse rhonchi worse on the right side CARDIOVASCULAR: S1, S2 normal. No murmurs, rubs, or gallops.  ABDOMEN: Soft, nontender, nondistended. Bowel sounds present. No organomegaly or mass.  EXTREMITIES: No pedal edema, cyanosis, or clubbing.  NEUROLOGIC: No facial droop noted. Patient is nonverbal and does not understand, so not following commands. According to daughter he said his baseline. PSYCHIATRIC: The patient is alert and nonverbal.  SKIN: No obvious rash, lesion, or ulcer.    LABORATORY PANEL:   CBC  Recent Labs Lab 08/31/15 0516  WBC 16.2*  HGB 14.7  HCT 42.2  PLT 185    ------------------------------------------------------------------------------------------------------------------  Chemistries   Recent Labs Lab 08/31/15 0516  NA 144  K 3.3*  CL 109  CO2 21*  GLUCOSE 248*  BUN 12  CREATININE 0.86  CALCIUM 9.4   ------------------------------------------------------------------------------------------------------------------  Cardiac Enzymes No results for input(s): TROPONINI in the last 168 hours. ------------------------------------------------------------------------------------------------------------------  RADIOLOGY:  Dg Chest 2 View  08/30/2015  CLINICAL DATA:  Coughing after eating earlier today. History of dementia and hypertension. EXAM: CHEST  2 VIEW COMPARISON:  None. FINDINGS: Patient is mildly rotated on both views and there is suboptimal inspiration. The heart size and mediastinal contours are unremarkable for age. There is mild aortic tortuosity. Increased density is noted over the lower thoracic spine on the lateral view, without definite corresponding finding on the frontal examination. There is no pleural effusion or pneumothorax. The bones appear unremarkable. IMPRESSION: Increased density over the lower thoracic spine on the lateral view could be due to an overlap of vascular and osseous structures. Early sequela of aspiration cannot be excluded. Radiographic follow up recommended. Electronically Signed   By: Carey Bullocks M.D.   On: 08/30/2015 14:19   Ct Chest Wo Contrast  08/30/2015  CLINICAL DATA:  Patient reportedly aspirated eating lunch today followed by coughing. Patient hypoxic on admission to the emergency department. EXAM: CT CHEST WITHOUT CONTRAST TECHNIQUE: Multidetector CT imaging of the chest was performed following the standard protocol without IV contrast. COMPARISON:  Current chest radiograph FINDINGS: Neck base and axilla: No mass or adenopathy. Thyroid is normal in size but heterogeneous which may be from  multiple small nodules. Mediastinum and hila: Heart normal in size and configuration. There are dense coronary artery calcifications. There is mild dilation of  the main pulmonary artery to 3.5 cm. The esophagus is distended throughout its entirety containing nonopaque through dense small bubbles of air. Esophagus is distended containing prudent small bubbles of air. There is no convincing esophageal mass. The debris in the esophagus extends to the upper thoracic esophagus. No mediastinal or hilar masses or enlarged lymph nodes. Lungs and pleura: There is lower lobe short segment bronchial impaction. There is surrounding reticular and peribronchovascular airspace opacity which is predominantly ground-glass. This predominates in the lower lobes is also evident in the right middle lobe. There is no pulmonary edema. No pleural effusion or pneumothorax. Limited upper abdomen:  No acute findings. Musculoskeletal: Mild degenerative changes throughout the thoracic spine. No osteoblastic or osteolytic lesions. IMPRESSION: 1. Findings are consistent with the history of aspiration with lower lobe bronchial impaction surrounded by reticular and airspace opacities. Similar but less prominent abnormalities are noted in the right middle lobe. 2. The esophagus is distended with debris and air. No CT evidence of esophageal mass/ obstruction. Findings may be from esophageal achalasia. Distal stricture is possible. Consider follow-up nonemergent esophagram or endoscopy. Electronically Signed   By: Amie Portland M.D.   On: 08/30/2015 17:42   Portable Chest 1 View  08/31/2015  CLINICAL DATA:  Pulmonary aspiration. EXAM: PORTABLE CHEST 1 VIEW COMPARISON:  08/30/2015 FINDINGS: Indistinct airspace opacities in both lower lobes are again observed, and appears similar to those depicted on yesterday' s chest CT. No new airspace opacity identified. Stable 4 mm calcified granuloma in the apical segment right upper lobe. Stable  atherosclerotic calcification of the aortic arch. IMPRESSION: 1. Stable bilateral lower lobe airspace opacities consistent with bibasilar pneumonia or aspiration pneumonitis. Electronically Signed   By: Gaylyn Rong M.D.   On: 08/31/2015 06:32    EKG:   Orders placed or performed during the hospital encounter of 08/30/15  . ED EKG  . ED EKG  . EKG 12-Lead  . EKG 12-Lead    ASSESSMENT AND PLAN:   75 y/o M past medical history significant for primary progressive aphasic dementia, hypertension, coronary artery disease presents from home secondary to aspiration.  #1 acute hypoxic respiratory failure-secondary to an aspiration episode. -No history of previous aspiration. So likely one-time choking. -Continue nothing by mouth and speech therapy eval -CT of the chest revealing foot in the esophagus and also right lower bronchus and possible pneumonia. -Continue IV Unasyn - hold other oral meds  #2 HTN- hold oral meds  #3 Dementia- nonverbal at baseline, does not understand or comprehend. No issues with eating usually At baseline Has caregivers at home. Ambulatory otherwise  #4 DVT prophylaxis-Lovenox  #5 hyperglycemia-no history of diabetes mellitus. Likely secondary to being on D5 drip while nothing by mouth. -Continue sliding scale insulin for now. Stop the drip once started on a diet.  Physical Therapy consult   All the records are reviewed and case discussed with Care Management/Social Workerr. Management plans discussed with the patient, family and they are in agreement.  CODE STATUS: DNR  TOTAL TIME TAKING CARE OF THIS PATIENT: 45 minutes.   POSSIBLE D/C IN 1-2 DAYS, DEPENDING ON CLINICAL CONDITION.   Saren Corkern M.D on 08/31/2015 at 3:33 PM  Between 7am to 6pm - Pager - [phone]  After 6pm go to [email or web address] - password [email or web address] [facility]  [place] [facility]  Office  [phone]  CC: Primary care physician; No primary care provider on file.

## 2015-08-31 NOTE — Progress Notes (Signed)
Patient rested quietly tonight and had one episode of vomiting at the start of shift. Lungs sound diminished this morning versus "gurgling" last night. He spiked a fever of 102.6 and PRN rectal Tylenol was given; one hour after, temperature down to 101.4 and as of this morning 99.8. Wife at bedside and VSS. Nursing staff will continue to monitor. Frank Richer, RN

## 2015-09-01 ENCOUNTER — Inpatient Hospital Stay: Payer: Medicare Other

## 2015-09-01 LAB — BASIC METABOLIC PANEL
ANION GAP: 10 (ref 5–15)
BUN: 8 mg/dL (ref 6–20)
CHLORIDE: 110 mmol/L (ref 101–111)
CO2: 23 mmol/L (ref 22–32)
Calcium: 9.4 mg/dL (ref 8.9–10.3)
Creatinine, Ser: 0.66 mg/dL (ref 0.61–1.24)
GFR calc Af Amer: >60 mL/min (ref >60)
GFR calc non Af Amer: >60 mL/min (ref >60)
Glucose, Bld: 179 mg/dL — ABNORMAL HIGH (ref 65–99)
POTASSIUM: 3.8 mmol/L (ref 3.5–5.1)
SODIUM: 143 mmol/L (ref 135–145)

## 2015-09-01 LAB — CBC
HCT: 41.1 % (ref 40.0–52.0)
HEMOGLOBIN: 13.9 g/dL (ref 13.0–18.0)
MCH: 30.3 pg (ref 26.0–34.0)
MCHC: 33.9 g/dL (ref 32.0–36.0)
MCV: 89.6 fL (ref 80.0–100.0)
PLATELETS: 158 10*3/uL (ref 150–440)
RBC: 4.58 MIL/uL (ref 4.40–5.90)
RDW: 14.2 % (ref 11.5–14.5)
WBC: 13.8 10*3/uL — ABNORMAL HIGH (ref 3.8–10.6)

## 2015-09-01 LAB — GLUCOSE, CAPILLARY
GLUCOSE-CAPILLARY: 168 mg/dL — AB (ref 65–99)
GLUCOSE-CAPILLARY: 151 mg/dL — AB (ref 65–99)
GLUCOSE-CAPILLARY: 156 mg/dL — AB (ref 65–99)
Glucose-Capillary: 138 mg/dL — ABNORMAL HIGH (ref 65–99)

## 2015-09-01 MED ORDER — MIRTAZAPINE 15 MG PO TABS
15.0000 mg | ORAL_TABLET | Freq: Every day | ORAL | Status: DC
  Administered 2015-09-01: 15 mg via ORAL
  Administered 2015-09-02: 7.5 mg via ORAL
  Filled 2015-09-01 (×2): qty 1

## 2015-09-01 MED ORDER — AMLODIPINE BESYLATE 10 MG PO TABS
10.0000 mg | ORAL_TABLET | Freq: Every day | ORAL | Status: DC
  Administered 2015-09-02 – 2015-09-05 (×4): 10 mg via ORAL
  Filled 2015-09-01 (×5): qty 1

## 2015-09-01 NOTE — Progress Notes (Signed)
Physical Therapy Evaluation Patient Details Name: Frank Lee MRN: [id number] DOB: [birth year] Today's Date: 09/01/2015   History of Present Illness  Patient is a 75 y.o. male admitted on 09 Feb. after experiencing aspiration/ARF. PMH includes progressive dementia (non-verbal aphasic), essential HTN, CAD.  Clinical Impression  Patient more alert for PT examination this afternoon with wife at bedside. 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 Patient's wife reports him to be incontinent as well. On evaluation, patient unable to follow commands due to expressive and receptive aphasia due to progressive dementia. Patient required maximal/+2 assistance to perform bed mobility and attempt sit to stand transfer with tendency to resist movement. It is believed that patient will benefit from continued PT services while in the hospital and that patient's wife will benefit from 24 hour assistance at home to prevent injury performing transfers until patient returns to baseline level of function.    Follow Up Recommendations Supervision/Assistance - 24 hour;Home health PT    Equipment Recommendations  None recommended by PT    Recommendations for Other Services       Precautions / Restrictions Precautions Precautions: Fall Restrictions Weight Bearing Restrictions: No      Mobility  Bed Mobility Overal bed mobility: Needs Assistance;+2 for physical assistance Bed Mobility: Supine to Sit;Sit to Supine     Supine to sit: Max assist;HOB elevated Sit to supine: +2 for physical assistance   General bed mobility comments: Patient requires tactile cues to move from supine to sit and sit to supine. Unable to follow commands. +2 to slide patient towards HOB.  Transfers Overall transfer level: Needs assistance Equipment used: None Transfers: Sit to/from Stand Sit to Stand: +2 physical assistance;From elevated surface          General transfer comment: Patient unable to follow commands. Used tactile assistance to try to move patient from sit to stand with tendency to posterior lean and then sit.   Ambulation/Gait                Stairs            Wheelchair Mobility    Modified Rankin (Stroke Patients Only)       Balance Overall balance assessment: Needs assistance;History of Falls Sitting-balance support: Feet supported Sitting balance-Leahy Scale: Fair   Postural control: Posterior lean Standing balance support: No upper extremity supported Standing balance-Leahy Scale: Zero                               Pertinent Vitals/Pain      Home Living Family/patient expects to be discharged to:: Private residence Living Arrangements: Spouse/significant other Available Help at Discharge: Family;Available 24 hours/day Type of Home: House Home Access: Stairs to enter Entrance Stairs-Rails: Can reach both Entrance Stairs-Number of Steps: 2 Home Layout: One level Home Equipment: None      Prior Function Level of Independence: Needs assistance   Gait / Transfers Assistance Needed: Patient performs gait with assistance from wife 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  ADL's / Homemaking Assistance Needed: Performed by wife        Hand Dominance        Extremity/Trunk Assessment   Upper Extremity Assessment: Difficult to assess due to impaired cognition           Lower Extremity Assessment: Difficult to assess due to impaired cognition  Communication   Communication: Receptive difficulties;Expressive difficulties  Cognition Arousal/Alertness: Awake/alert Behavior During Therapy: Flat affect Overall Cognitive Status: History of cognitive impairments - at baseline                      General Comments      Exercises        Assessment/Plan    PT Assessment Patient needs continued PT services  PT Diagnosis Difficulty  walking;Altered mental status   PT Problem List Decreased activity tolerance;Decreased balance;Decreased safety awareness;Decreased cognition;Decreased mobility  PT Treatment Interventions Gait training;Functional mobility training;Therapeutic activities;Therapeutic exercise;Balance training;Cognitive remediation;Patient/family education   PT Goals (Current goals can be found in the Care Plan section) Acute Rehab PT Goals Patient Stated Goal: Unable to state PT Goal Formulation: Patient unable to participate in goal setting Time For Goal Achievement: 09/15/15 Potential to Achieve Goals: Good    Frequency Min 2X/week   Barriers to discharge        Co-evaluation               End of Session Equipment Utilized During Treatment: Gait belt;Oxygen Activity Tolerance: Other (comment) (Patient unable to follow commands) Patient left: in bed;with call bell/phone within reach;with bed alarm set;with family/visitor present           Time: 1610-9604 PT Time Calculation (min) (ACUTE ONLY): 20 min   Charges:   PT Evaluation $PT Eval Low Complexity: 1 Procedure     PT G Codes:        Neita Carp, PT, DPT 09/01/2015, 4:39 PM

## 2015-09-01 NOTE — Progress Notes (Signed)
PT Cancellation Note  Patient Details Name: Frank Lee MRN: [id number] DOB: 18-Jul-1941   Cancelled Treatment:    Reason Eval/Treat Not Completed: Fatigue/lethargy limiting ability to participate Daughter in room reporting that her father did not sleep much last night and that she has not been able to wake his this AM.  We did try to wake him unsuccessfully, will try back later when pt is appropriate to participate.   Loran Senters, PT, DPT [phone]  Malachi Pro 09/01/2015, 1:55 PM

## 2015-09-01 NOTE — Progress Notes (Signed)
Speech Therapy Note: received order, reviewed chart notes; consulted MD then met w/ family/wife. Pt currently lethargic and only opened eyes x1 to max. Verbal/tactile stim. Pt does have a baseline of advanced Dementia and received Ativan during the night d/t agitation per wife/NSG. Due to pt being unable to arouse, rec. Hold on BSE and any initiation of an oral diet at this time sec. to increased risk for aspiration. Pt does have an order for po meds (if able) per MD; NSG to fully assess and if pt arouses appropriately for NSG then NSG will attempt the po meds crushed in puree. Family shown and education on oral care using swabs for hygiene and stim. ST will f/u on Monday w/ pt's status.

## 2015-09-01 NOTE — Progress Notes (Signed)
[facility] - [place] [facility]   PATIENT NAME: Frank Lee    MR#:  [id number]  DATE OF BIRTH:  09/16/1940  SUBJECTIVE:  CHIEF COMPLAINT:   Chief Complaint  Patient presents with  . Aspiration   - Patient is nonverbal, admitted after an aspiration/choking episode - breathing better per family, currently on 3 L nasal cannula- down from 5L  -Doesn't have any swallowing difficulty usually.  REVIEW OF SYSTEMS:  Review of Systems  Unable to perform ROS: patient nonverbal    DRUG ALLERGIES:  No Known Allergies  VITALS:  Blood pressure 163/94, pulse 107, temperature 98.6 F (37 C), temperature source Oral, resp. rate 18, height  (1.778 m), weight 77.61 kg (171 lb 1.6 oz), SpO2 96 %.  PHYSICAL EXAMINATION:  Physical Exam  GENERAL:  75 y.o.-year-old patient lying in the bed with no acute distress.  EYES: Pupils equal, round, reactive to light and accommodation. No scleral icterus. Extraocular muscles intact.  HEENT: Head atraumatic, normocephalic. Oropharynx and nasopharynx clear.  NECK:  Supple, no jugular venous distention. No thyroid enlargement, no tenderness.  LUNGS: Normal breath sounds bilaterally, no wheezing, rales or crepitation. No use of accessory muscles of respiration.decreased bibasilar breath sounds Basilar coarse rhonchi worse on the right side CARDIOVASCULAR: S1, S2 normal. No murmurs, rubs, or gallops.  ABDOMEN: Soft, nontender, nondistended. Bowel sounds present. No organomegaly or mass.  EXTREMITIES: No pedal edema, cyanosis, or clubbing.  NEUROLOGIC: No facial droop noted. Patient is nonverbal and does not understand, so not following commands. PSYCHIATRIC: The patient is alert and nonverbal.  SKIN: No obvious rash, lesion, or ulcer.    LABORATORY PANEL:   CBC  Recent Labs Lab 09/01/15 0519  WBC 13.8*  HGB 13.9  HCT 41.1  PLT 158    ------------------------------------------------------------------------------------------------------------------  Chemistries   Recent Labs Lab 09/01/15 0519  NA 143  K 3.8  CL 110  CO2 23  GLUCOSE 179*  BUN 8  CREATININE 0.66  CALCIUM 9.4   ------------------------------------------------------------------------------------------------------------------  Cardiac Enzymes No results for input(s): TROPONINI in the last 168 hours. ------------------------------------------------------------------------------------------------------------------  RADIOLOGY:  Dg Chest 2 View  08/30/2015  CLINICAL DATA:  Coughing after eating earlier today. History of dementia and hypertension. EXAM: CHEST  2 VIEW COMPARISON:  None. FINDINGS: Patient is mildly rotated on both views and there is suboptimal inspiration. The heart size and mediastinal contours are unremarkable for age. There is mild aortic tortuosity. Increased density is noted over the lower thoracic spine on the lateral view, without definite corresponding finding on the frontal examination. There is no pleural effusion or pneumothorax. The bones appear unremarkable. IMPRESSION: Increased density over the lower thoracic spine on the lateral view could be due to an overlap of vascular and osseous structures. Early sequela of aspiration cannot be excluded. Radiographic follow up recommended. Electronically Signed   By: Carey Bullocks M.D.   On: 08/30/2015 14:19   Ct Chest Wo Contrast  08/30/2015  CLINICAL DATA:  Patient reportedly aspirated eating lunch today followed by coughing. Patient hypoxic on admission to the emergency department. EXAM: CT CHEST WITHOUT CONTRAST TECHNIQUE: Multidetector CT imaging of the chest was performed following the standard protocol without IV contrast. COMPARISON:  Current chest radiograph FINDINGS: Neck base and axilla: No mass or adenopathy. Thyroid is normal in size but heterogeneous which may be from multiple  small nodules. Mediastinum and hila: Heart normal in size and configuration. There are dense coronary artery calcifications. There is mild dilation of the  main pulmonary artery to 3.5 cm. The esophagus is distended throughout its entirety containing nonopaque through dense small bubbles of air. Esophagus is distended containing prudent small bubbles of air. There is no convincing esophageal mass. The debris in the esophagus extends to the upper thoracic esophagus. No mediastinal or hilar masses or enlarged lymph nodes. Lungs and pleura: There is lower lobe short segment bronchial impaction. There is surrounding reticular and peribronchovascular airspace opacity which is predominantly ground-glass. This predominates in the lower lobes is also evident in the right middle lobe. There is no pulmonary edema. No pleural effusion or pneumothorax. Limited upper abdomen:  No acute findings. Musculoskeletal: Mild degenerative changes throughout the thoracic spine. No osteoblastic or osteolytic lesions. IMPRESSION: 1. Findings are consistent with the history of aspiration with lower lobe bronchial impaction surrounded by reticular and airspace opacities. Similar but less prominent abnormalities are noted in the right middle lobe. 2. The esophagus is distended with debris and air. No CT evidence of esophageal mass/ obstruction. Findings may be from esophageal achalasia. Distal stricture is possible. Consider follow-up nonemergent esophagram or endoscopy. Electronically Signed   By: Amie Portland M.D.   On: 08/30/2015 17:42   Portable Chest 1 View  08/31/2015  CLINICAL DATA:  Pulmonary aspiration. EXAM: PORTABLE CHEST 1 VIEW COMPARISON:  08/30/2015 FINDINGS: Indistinct airspace opacities in both lower lobes are again observed, and appears similar to those depicted on yesterday' s chest CT. No new airspace opacity identified. Stable 4 mm calcified granuloma in the apical segment right upper lobe. Stable atherosclerotic  calcification of the aortic arch. IMPRESSION: 1. Stable bilateral lower lobe airspace opacities consistent with bibasilar pneumonia or aspiration pneumonitis. Electronically Signed   By: Gaylyn Rong M.D.   On: 08/31/2015 06:32    EKG:   Orders placed or performed during the hospital encounter of 08/30/15  . ED EKG  . ED EKG  . EKG 12-Lead  . EKG 12-Lead    ASSESSMENT AND PLAN:   75 y/o M past medical history significant for primary progressive aphasic dementia, hypertension, coronary artery disease presents from home secondary to aspiration.  #1 acute hypoxic respiratory failure-secondary to an aspiration episode. -No history of previous aspiration. So likely one-time choking. -pending speech therapy eval- started oral meds in apple sauce -CT of the chest revealing foot in the esophagus and also right lower bronchus and possible pneumonia. -Continue IV Unasyn  #2 HTN- started norvasc today  #3 Dementia- nonverbal at baseline, does not understand or comprehend. No issues with eating usually At baseline Has caregivers at home. Ambulatory otherwise  #4 DVT prophylaxis-Lovenox  #5 Hyperglycemia-no history of diabetes mellitus. Likely secondary to being on D5 drip while nothing by mouth. -Continue sliding scale insulin for now. Stop the drip once started on a diet.  Physical Therapy consulted Possible discharge tomorrow if weaned off o2.   All the records are reviewed and case discussed with Care Management/Social Workerr. Management plans discussed with the patient, family and they are in agreement.  CODE STATUS: DNR  TOTAL TIME TAKING CARE OF THIS PATIENT: 42 minutes.   POSSIBLE D/C IN 1-2 DAYS, DEPENDING ON CLINICAL CONDITION.   Enid Baas M.D on 09/01/2015 at 8:46 AM  Between 7am to 6pm - Pager - [phone]  After 6pm go to [email or web address] - password [email or web address]  [place] [facility]  Office  [phone]  CC: Primary care physician; No  primary care provider on file.

## 2015-09-01 NOTE — Progress Notes (Signed)
Patient lethargic and asleep throughout the morning unable to give patient blood pressure medication or assess patients swallowing. BP 154/96 nitro paste in place on left leg. Dr. Nemiah Commander notified. No orders received. Will continue to monitor.

## 2015-09-01 NOTE — Consult Note (Signed)
Pulmonary Critical Care  Initial Consult Note   Frank Lee:[id number] DOB: 1941-05-02 DOA: 08/30/2015  Referring physician: Katharina Caper, MD PCP: No primary care provider on file.   Chief Complaint: Pneumonia  HPI: Frank Lee is a 75 y.o. male with prior history of HTN CAD Dementia presented with aspiration. Patient had been eating steak at the time. Patient started to cough and was brought to the ED. In the ED he was noted to have saturations of 88% initially. Patient had a CXR done which shows pattern of aspiration. CT scan done and this does not show any foreign body in the larger airways at this time. In discussion with the family at this time they do not want to do any further invasive testing. Patient is comfortable at this time no distress   Review of Systems:   Patient is not able to provide a ROS  Past Medical History  Diagnosis Date  . Hypertension   . Dementia    Past Surgical History  Procedure Laterality Date  . Cardiac stents     Social History:  reports that he has never smoked. He does not have any smokeless tobacco history on file. He reports that he does not drink alcohol. His drug history is not on file.  No Known Allergies  Family History  Problem Relation Age of Onset  . Stroke Mother   . Cancer Father     Prior to Admission medications   Medication Sig Start Date End Date Taking? Authorizing Provider  simvastatin (ZOCOR) 20 MG tablet Take 20 mg by mouth daily.   Yes Historical Provider, MD  amLODipine (NORVASC) 10 MG tablet Take 30 mg by mouth daily.    Historical Provider, MD  aspirin EC 325 MG tablet Take 325 mg by mouth daily.    Historical Provider, MD  hydrochlorothiazide (MICROZIDE) 12.5 MG capsule Take 12.5 mg by mouth daily.    Historical Provider, MD  mirtazapine (REMERON) 15 MG tablet Take 15 mg by mouth at bedtime.    Historical Provider, MD  naproxen (NAPROSYN) 250 MG tablet Take by mouth 2 (two) times daily with a meal.     Historical Provider, MD   Physical Exam: Filed Vitals:   08/31/15 1939 09/01/15 0011 09/01/15 0442 09/01/15 0828  BP: 128/88 164/92 163/94   Pulse: 106 108 107   Temp: 99.5 F (37.5 C) 99.4 F (37.4 C) 98.6 F (37 C)   TempSrc: Oral Oral Oral   Resp: 18     Height:      Weight:      SpO2: 100%  96% 96%    Wt Readings from Last 3 Encounters:  08/31/15 77.61 kg (171 lb 1.6 oz)  06/08/15 73.936 kg (163 lb)    General:  Appears calm and comfortable Eyes: PERRL, normal lids, irises & conjunctiva ENT: normal lips & tongue Neck: no LAD, masses or thyromegaly Cardiovascular: RRR, no m/r/g. No LE edema. Respiratory: coarse BS bilaterally Abdomen: soft, nontender Skin: no rash or induration seen on limited exam Musculoskeletal: grossly normal tone BUE/BLE Psychiatric: unable to assess Neurologic: non-verbal.          Labs on Admission:  Basic Metabolic Panel:  Recent Labs Lab 08/30/15 1429 08/31/15 0516 09/01/15 0519  NA 142 144 143  K 3.4* 3.3* 3.8  CL 104 109 110  CO2 23 21* 23  GLUCOSE 226* 248* 179*  BUN CREATININE 0.79 0.86 0.66  CALCIUM 10.0 9.4 9.4  Liver Function Tests: No results for input(s): AST, ALT, ALKPHOS, BILITOT, PROT, ALBUMIN in the last 168 hours. No results for input(s): LIPASE, AMYLASE in the last 168 hours. No results for input(s): AMMONIA in the last 168 hours. CBC:  Recent Labs Lab 08/30/15 1429 08/31/15 0516 09/01/15 0519  WBC 17.3* 16.2* 13.8*  NEUTROABS 14.4*  --   --   HGB 16.7 14.7 13.9  HCT 49.8 42.2 41.1  MCV 90.4 88.2 89.6  PLT 212 185 158   Cardiac Enzymes: No results for input(s): CKTOTAL, CKMB, CKMBINDEX, TROPONINI in the last 168 hours.  BNP (last 3 results) No results for input(s): BNP in the last 8760 hours.  ProBNP (last 3 results) No results for input(s): PROBNP in the last 8760 hours.  CBG:  Recent Labs Lab 08/31/15 1603 08/31/15 2129 09/01/15 0759  GLUCAP 181* 147* 168*    Radiological  Exams on Admission: Dg Chest 2 View  08/30/2015  CLINICAL DATA:  Coughing after eating earlier today. History of dementia and hypertension. EXAM: CHEST  2 VIEW COMPARISON:  None. FINDINGS: Patient is mildly rotated on both views and there is suboptimal inspiration. The heart size and mediastinal contours are unremarkable for age. There is mild aortic tortuosity. Increased density is noted over the lower thoracic spine on the lateral view, without definite corresponding finding on the frontal examination. There is no pleural effusion or pneumothorax. The bones appear unremarkable. IMPRESSION: Increased density over the lower thoracic spine on the lateral view could be due to an overlap of vascular and osseous structures. Early sequela of aspiration cannot be excluded. Radiographic follow up recommended. Electronically Signed   By: Carey Bullocks M.D.   On: 08/30/2015 14:19   Ct Chest Wo Contrast  08/30/2015  CLINICAL DATA:  Patient reportedly aspirated eating lunch today followed by coughing. Patient hypoxic on admission to the emergency department. EXAM: CT CHEST WITHOUT CONTRAST TECHNIQUE: Multidetector CT imaging of the chest was performed following the standard protocol without IV contrast. COMPARISON:  Current chest radiograph FINDINGS: Neck base and axilla: No mass or adenopathy. Thyroid is normal in size but heterogeneous which may be from multiple small nodules. Mediastinum and hila: Heart normal in size and configuration. There are dense coronary artery calcifications. There is mild dilation of the main pulmonary artery to 3.5 cm. The esophagus is distended throughout its entirety containing nonopaque through dense small bubbles of air. Esophagus is distended containing prudent small bubbles of air. There is no convincing esophageal mass. The debris in the esophagus extends to the upper thoracic esophagus. No mediastinal or hilar masses or enlarged lymph nodes. Lungs and pleura: There is lower lobe short  segment bronchial impaction. There is surrounding reticular and peribronchovascular airspace opacity which is predominantly ground-glass. This predominates in the lower lobes is also evident in the right middle lobe. There is no pulmonary edema. No pleural effusion or pneumothorax. Limited upper abdomen:  No acute findings. Musculoskeletal: Mild degenerative changes throughout the thoracic spine. No osteoblastic or osteolytic lesions. IMPRESSION: 1. Findings are consistent with the history of aspiration with lower lobe bronchial impaction surrounded by reticular and airspace opacities. Similar but less prominent abnormalities are noted in the right middle lobe. 2. The esophagus is distended with debris and air. No CT evidence of esophageal mass/ obstruction. Findings may be from esophageal achalasia. Distal stricture is possible. Consider follow-up nonemergent esophagram or endoscopy. Electronically Signed   By: Amie Portland M.D.   On: 08/30/2015 17:42   Portable Chest 1 View  08/31/2015  CLINICAL DATA:  Pulmonary aspiration. EXAM: PORTABLE CHEST 1 VIEW COMPARISON:  08/30/2015 FINDINGS: Indistinct airspace opacities in both lower lobes are again observed, and appears similar to those depicted on yesterday' s chest CT. No new airspace opacity identified. Stable 4 mm calcified granuloma in the apical segment right upper lobe. Stable atherosclerotic calcification of the aortic arch. IMPRESSION: 1. Stable bilateral lower lobe airspace opacities consistent with bibasilar pneumonia or aspiration pneumonitis. Electronically Signed   By: Gaylyn Rong M.D.   On: 08/31/2015 06:32    EKG: Independently reviewed.  Assessment/Plan Principal Problem:   Acute respiratory failure with hypoxia (HCC) Active Problems:   Aspiration pneumonitis (HCC)   Leukocytosis   Hypokalemia   Hyperglycemia   1. Acute Respiratory Failure -slowly improving likely due to choking episode -would monitor saturations  closely -oxygen as needed  2. Aspiration Pneumonia -first time event -SLP evaluation -currently no indication for a bronchoscopy     I have personally obtained a history, examined the patient, evaluated laboratory and imaging results, formulated the assessment and plan and placed orders.  The Patient requires high complexity decision making for assessment and support.    Yevonne Pax, MD [facility] Pulmonary Critical Care Medicine Sleep Medicine

## 2015-09-02 LAB — GLUCOSE, CAPILLARY
GLUCOSE-CAPILLARY: 200 mg/dL — AB (ref 65–99)
GLUCOSE-CAPILLARY: 150 mg/dL — AB (ref 65–99)
Glucose-Capillary: 162 mg/dL — ABNORMAL HIGH (ref 65–99)
Glucose-Capillary: 155 mg/dL — ABNORMAL HIGH (ref 65–99)

## 2015-09-02 MED ORDER — ACETAMINOPHEN 325 MG PO TABS
650.0000 mg | ORAL_TABLET | ORAL | Status: DC | PRN
  Administered 2015-09-02 – 2015-09-03 (×2): 650 mg via ORAL
  Filled 2015-09-02 (×2): qty 2

## 2015-09-02 MED ORDER — MIRTAZAPINE 15 MG PO TABS: 7.5000 mg | ORAL_TABLET | Freq: Once | ORAL | Status: AC

## 2015-09-02 MED ORDER — ALBUTEROL SULFATE (2.5 MG/3ML) 0.083% IN NEBU: 2.5000 mg | INHALATION_SOLUTION | Freq: Four times a day (QID) | RESPIRATORY_TRACT | Status: DC | PRN

## 2015-09-02 MED ORDER — MIRTAZAPINE 15 MG PO TABS
15.0000 mg | ORAL_TABLET | Freq: Every day | ORAL | Status: DC
  Administered 2015-09-03 – 2015-09-04 (×2): 15 mg via ORAL
  Filled 2015-09-02 (×2): qty 1

## 2015-09-02 NOTE — Care Management (Signed)
Presents from home with sx concerning for aspiration.  Patient is nonverbal at baseline and has advanced dementia.  Patient has been weaned from 02 and these sats appear to be at rest.  Will require exertional room air sats prior to discharge to assess need for home 02.  Wife is caregiver.  CM will need to assess how she is meeting all of patient's care needs which appear to be many

## 2015-09-02 NOTE — Progress Notes (Signed)
[facility] [place] [facility]   PATIENT NAME: Frank Lee    MR#:  [id number]  DATE OF BIRTH:  1941/07/03  SUBJECTIVE:  CHIEF COMPLAINT:   Chief Complaint  Patient presents with  . Aspiration   - more alert today, still on 2l o2 - CXR with bibasilar pneumonia, pending speech consult.  REVIEW OF SYSTEMS:  Review of Systems  Unable to perform ROS: patient nonverbal    DRUG ALLERGIES:  No Known Allergies  VITALS:  Blood pressure 144/78, pulse 100, temperature 99.5 F (37.5 C), temperature source Oral, resp. rate 19, height  (1.778 m), weight 77.61 kg (171 lb 1.6 oz), SpO2 96 %.  PHYSICAL EXAMINATION:  Physical Exam  GENERAL:  75 y.o.-year-old patient lying in the bed with no acute distress.  EYES: Pupils equal, round, reactive to light and accommodation. No scleral icterus. Extraocular muscles intact.  HEENT: Head atraumatic, normocephalic. Oropharynx and nasopharynx clear.  NECK:  Supple, no jugular venous distention. No thyroid enlargement, no tenderness.  LUNGS: Normal breath sounds bilaterally, no wheezing, rales or crepitation. No use of accessory muscles of respiration.decreased bibasilar breath sounds Basilar coarse rhonchi worse on the right side CARDIOVASCULAR: S1, S2 normal. No murmurs, rubs, or gallops.  ABDOMEN: Soft, nontender, nondistended. Bowel sounds present. No organomegaly or mass.  EXTREMITIES: No pedal edema, cyanosis, or clubbing.  NEUROLOGIC: No facial droop noted. Patient is nonverbal and does not understand, so not following commands. PSYCHIATRIC: The patient is alert and nonverbal.  SKIN: No obvious rash, lesion, or ulcer.    LABORATORY PANEL:   CBC  Recent Labs Lab 09/01/15 0519  WBC 13.8*  HGB 13.9  HCT 41.1  PLT 158   ------------------------------------------------------------------------------------------------------------------  Chemistries   Recent Labs Lab 09/01/15 0519  NA 143  K  3.8  CL 110  CO2 23  GLUCOSE 179*  BUN 8  CREATININE 0.66  CALCIUM 9.4   ------------------------------------------------------------------------------------------------------------------  Cardiac Enzymes No results for input(s): TROPONINI in the last 168 hours. ------------------------------------------------------------------------------------------------------------------  RADIOLOGY:  Dg Chest 1 View  09/01/2015  CLINICAL DATA:  Possible aspiration.  Pneumonia. EXAM: CHEST 1 VIEW COMPARISON:  Yesterday FINDINGS: Bilateral perihilar and basilar airspace disease is unchanged. Lung volumes are mildly improved. No edema or effusion. No air leak. Normal heart size and aortic contours. IMPRESSION: History of aspiration with unchanged bibasilar pneumonia or pneumonitis. Electronically Signed   By: Marnee Spring M.D.   On: 09/01/2015 08:58    EKG:   Orders placed or performed during the hospital encounter of 08/30/15  . ED EKG  . ED EKG  . EKG 12-Lead  . EKG 12-Lead    ASSESSMENT AND PLAN:   75 y/o M past medical history significant for primary progressive aphasic dementia, hypertension, coronary artery disease presents from home secondary to aspiration.  #1 acute hypoxic respiratory failure-secondary to an aspiration episode. -No history of previous aspiration. So likely one-time choking. -pending speech therapy eval- tolerating medications in applesauce well today. Since he is more alert, we'll start him on dysphagia 1 diet with honey thick liquids pending speech eval for now. -CT of the chest revealing foot in the esophagus and also right lower bronchus and possible pneumonia. -Continue IV Unasyn-changed to Augmentin at discharge -Continue to wean off oxygen  #2 HTN- started norvasc   #3 Dementia- nonverbal at baseline, does not understand or comprehend. No issues with eating usually At baseline Has caregivers at home. Ambulatory otherwise  #4 DVT  prophylaxis-Lovenox  #5  Hyperglycemia-no history of diabetes mellitus. Will stop his D5 now that he is started on a diet.  Physical Therapy consulted Possible discharge tomorrow if weaned off o2 and after speech eval   All the records are reviewed and case discussed with Care Management/Social Workerr. Management plans discussed with the patient, family and they are in agreement.  CODE STATUS: DNR  TOTAL TIME TAKING CARE OF THIS PATIENT: 42 minutes.   POSSIBLE D/C TOMORROW, DEPENDING ON CLINICAL CONDITION.   Enid Baas M.D on 09/02/2015 at 12:28 PM  Between 7am to 6pm - Pager - [phone]  After 6pm go to [email or web address] - password [email or web address] [facility]  [facility]  Office  [phone]  CC: Primary care physician; No primary care provider on file.

## 2015-09-02 NOTE — Progress Notes (Signed)
Patient weaned off oxygen. SPO2 93 on room air. Will continue to monitor.

## 2015-09-03 ENCOUNTER — Inpatient Hospital Stay: Payer: Medicare Other

## 2015-09-03 LAB — CBC
HEMATOCRIT: 43.2 % (ref 40.0–52.0)
HEMOGLOBIN: 14.8 g/dL (ref 13.0–18.0)
MCH: 30.4 pg (ref 26.0–34.0)
MCHC: 34.2 g/dL (ref 32.0–36.0)
MCV: 89 fL (ref 80.0–100.0)
Platelets: 195 10*3/uL (ref 150–440)
RBC: 4.85 MIL/uL (ref 4.40–5.90)
RDW: 13.9 % (ref 11.5–14.5)
WBC: 11.6 10*3/uL — ABNORMAL HIGH (ref 3.8–10.6)

## 2015-09-03 LAB — BASIC METABOLIC PANEL
Anion gap: 6 (ref 5–15)
BUN: 13 mg/dL (ref 6–20)
CHLORIDE: 114 mmol/L — AB (ref 101–111)
CO2: 26 mmol/L (ref 22–32)
CREATININE: 0.72 mg/dL (ref 0.61–1.24)
Calcium: 9.3 mg/dL (ref 8.9–10.3)
GFR calc Af Amer: >60 mL/min (ref >60)
GFR calc non Af Amer: >60 mL/min (ref >60)
GLUCOSE: 176 mg/dL — AB (ref 65–99)
Potassium: 3.5 mmol/L (ref 3.5–5.1)
Sodium: 146 mmol/L — ABNORMAL HIGH (ref 135–145)

## 2015-09-03 LAB — GLUCOSE, CAPILLARY
GLUCOSE-CAPILLARY: 169 mg/dL — AB (ref 65–99)
Glucose-Capillary: 156 mg/dL — ABNORMAL HIGH (ref 65–99)
Glucose-Capillary: 138 mg/dL — ABNORMAL HIGH (ref 65–99)
Glucose-Capillary: 178 mg/dL — ABNORMAL HIGH (ref 65–99)

## 2015-09-03 MED ORDER — METHYLPREDNISOLONE 4 MG PO TBPK: 8.0000 mg | ORAL_TABLET | Freq: Every evening | ORAL | Status: DC

## 2015-09-03 MED ORDER — METHYLPREDNISOLONE 4 MG PO TBPK: 4.0000 mg | ORAL_TABLET | ORAL | Status: DC

## 2015-09-03 MED ORDER — METHYLPREDNISOLONE 4 MG PO TBPK: 4.0000 mg | ORAL_TABLET | Freq: Four times a day (QID) | ORAL | Status: DC

## 2015-09-03 MED ORDER — METHYLPREDNISOLONE 4 MG PO TBPK: 4.0000 mg | ORAL_TABLET | Freq: Three times a day (TID) | ORAL | Status: DC

## 2015-09-03 MED ORDER — METHYLPREDNISOLONE 4 MG PO TBPK: 8.0000 mg | ORAL_TABLET | Freq: Every morning | ORAL | Status: DC

## 2015-09-03 NOTE — Progress Notes (Signed)
Heidi, PT approached this RN stating that patient seems to be weaker on the right and leaning more that way when standing/up in chair compared to prior PT assessment. She also asked me to assess for facial droop. Upon assessment, patient sleeps with his mouth open which jaw does seem to drop to the right, but straightens up when he is awake and closes his mouth - when speaking with wife, she stated there may have been a slight droop since about October. Pupils are equal and reactive. Patient has trouble following commands, expressive/receptive aphasia - pre-admission baseline. Patient does grip with right hand when fingers are placed in his palm - again, not really following directions. Dr. Elisabeth Pigeon aware of PT concerns and RN assessment. Order for head CT. Will continue to monitor.

## 2015-09-03 NOTE — Progress Notes (Addendum)
Physical Therapy Treatment Patient Details Name: Frank Lee MRN: [id number] DOB: 09-08-1940 Today's Date: 09/03/2015    History of Present Illness Patient is a 75 y.o. male admitted on 09 Feb. after experiencing aspiration/ARF. PMH includes progressive dementia (non-verbal aphasic), essential HTN, CAD.    PT Comments    Spouse present during session. Notes pt has been more lethargic. Pt with flat affect, but also some facial droop on the right. When questioned spouse states current presentation is normal, but when specifically asked about facial droop, spouse notes this may be worse than baseline. Pt requires 2 person assist for bed mobility and transfers. Pt demonstrating new lean to the right in sit and stand. Pt was able to demonstrate ambulation, but significantly below baseline per spouse. Pt requires 2 person assist for ambulation with increased lean to the right as well. Spouse pleased to see pt out of bed and more alert and agreeable to pt sitting in the chair for continued alertness. Discussed findings/concern with possible CVA with nurse as well as care manager. Nurse to call MD. Nurse called to inform pt will be going for a CT scan of the head today. Continue PT as appropriate.   Follow Up Recommendations  SNF (significantly below baseline per spouse)     Equipment Recommendations  None recommended by PT    Recommendations for Other Services       Precautions / Restrictions Precautions Precautions: Fall Restrictions Weight Bearing Restrictions: No    Mobility  Bed Mobility Overal bed mobility: Needs Assistance Bed Mobility: Supine to Sit     Supine to sit: Max assist;HOB elevated;+2 for safety/equipment     General bed mobility comments: Pt cannot follow instructions; often moves LEs back from position trying to attain. Once in sit pt demonstrates heavy lean to R that he does correct at times independently, but not consistently  Transfers Overall transfer  level: Needs assistance Equipment used: None Transfers: Sit to/from Stand Sit to Stand: Mod assist;Min assist;+2 physical assistance (Mod of one Min of another)         General transfer comment: Pt follows body gestures and tactile cueing best. Continues to demonstrate heavier lean to the R requiring Mod A on R and Min on L  Ambulation/Gait Ambulation/Gait assistance: Min assist;Mod assist;+2 physical assistance Ambulation Distance (Feet): 20 Feet (performed 2x) Assistive device: 2 person hand held assist Gait Pattern/deviations: Step-to pattern;Decreased stance time - right (somewhat high quick stepping; less coordinated on the R)     General Gait Details: Continues with heavy lean to the R; according to spouse not near baseline at all. Less coordinated R step and less stance time. increased difficulty with turn for chair approach.    Stairs            Wheelchair Mobility    Modified Rankin (Stroke Patients Only)       Balance Overall balance assessment: Needs assistance Sitting-balance support: Single extremity supported;Bilateral upper extremity supported Sitting balance-Leahy Scale: Fair (alternating with poor at times with R lean)   Postural control: Right lateral lean (inconsistently maintain center with assist to correct) Standing balance support: Bilateral upper extremity supported Standing balance-Leahy Scale: Poor Standing balance comment: Requires greater assist on R than L                    Cognition Arousal/Alertness: Awake/alert Behavior During Therapy: Flat affect Overall Cognitive Status: History of cognitive impairments - at baseline  Exercises      General Comments        Pertinent Vitals/Pain      Home Living                      Prior Function            PT Goals (current goals can now be found in the care plan section) Progress towards PT goals: Progressing toward goals     Frequency  Min 2X/week    PT Plan Other (comment);Discharge plan needs to be updated (may need to consider SNF)    Co-evaluation             End of Session Equipment Utilized During Treatment: Gait belt Activity Tolerance: Patient limited by fatigue Patient left: in chair;with call bell/phone within reach;with chair alarm set;with family/visitor present (pillow under R arm)     Time: 1426-1500 PT Time Calculation (min) (ACUTE ONLY): 34 min  Charges:  $Gait Training: 8-22 mins $Therapeutic Activity: 8-22 mins                    G Codes:      Kristeen Miss 09/03/2015, 3:27 PM

## 2015-09-03 NOTE — Care Management Important Message (Signed)
Important Message  Patient Details  Name: Frank Lee MRN: [id number] Date of Birth: September 01, 1940   Medicare Important Message Given:  Yes    Olegario Messier A Nesta Kimple 09/03/2015, 3:00 PM

## 2015-09-03 NOTE — Care Management (Signed)
Patient did participate with physical therapy but of concern, it was noticed that patient seems to be weaker on the right and body leans to the right which is not patient's baseline.  Head CT has been ordered. Patient was not able to speak with CM today.  She was "exhausted and just sitting down to lunch."   She is the primary caregiver.  If patient can walk or function at his baseline, he can return home with home health

## 2015-09-03 NOTE — Progress Notes (Signed)
Pt's wife asking for PT to come work with patient and answer some questions. Spoke with Irving Burton, PT - said someone will hopefully be by this afternoon to see patient.

## 2015-09-03 NOTE — Evaluation (Signed)
Clinical/Bedside Swallow Evaluation Patient Details  Name: Frank Lee MRN: [id number] Date of Birth: 01/28/1941  Today's Date: 09/03/2015 Time: SLP Start Time (ACUTE ONLY): 1030 SLP Stop Time (ACUTE ONLY): 1145 SLP Time Calculation (min) (ACUTE ONLY): 75 min  Past Medical History:  Past Medical History  Diagnosis Date  . Hypertension   . Dementia    Past Surgical History:  Past Surgical History  Procedure Laterality Date  . Cardiac stents     HPI:  Pt is a 75 y.o. male with a known history of essential hypertension, carotid artery disease, status post 2 stent placement, dementia, presented to the hospital with complaints of aspiration. Apparently patient was eating lunch some steak with gravy" and rice and started foaming and coughing. Patient's sister did not notice any Hx of food coughed up, but patient was severely uncomfortable and he was brought to emergency room for further evaluation where he was noted to be hypoxic and now requires 5 L of oxygen through nasal cannulas and his O2 sats ranging between 88-90%. Hospitalist services were contacted for admission. Patient is not able to give a review of systems since he is nonverbal due to advanced dementia. Wife stated she has to carefully monitor pt while eating d/t pt being impulsive and putting "too much in his mouth". Pt has been eating a fairly regular diet per Wife but requires monitoring (several years now) per her report. Pt is also nonverbal and doesn't follow verbal instructions(baseline per wife). Due to pt being more awake yesterday, MD initiated a dys. 1 w/ Honey liquids diet - wife and NSG reported pt has taken minimal amounts of either.    Assessment / Plan / Recommendation Clinical Impression  Pt appears to present w/ moderate+ oropharyngeal phase dysphagia c/b decreased overall oral awarenss for bolus acceptance followed by increased oral phase time w/ trials and slower A-P transit. Suspect a delayed pharyngeal swallow  (demo. w/ all trial consistencies) but given time w/ each trial and intermittent verbal/tactile cues, pt exhibited the delayed pharyngeal swallow w/ adequate oral clearing. As trials continued and pt appeared to fatigue w/ the exertion of the trials(mental and physical), pt began to exhibit increased oral phase time w/ diffuse residue w/ the last trial of puree; no oral prep for the swallow noted. When given the oral stim of spoon to mouth moistened by Nectar liquids, pt initiated labial and lingual movements then transferred/swallowed bolus material and cleared adequately. No further trials were given; pt allowed to rest - he immediately closed eyes. Pt required feeding and was only briefly attentive to task given the oral stimulation of the bolus trials w/ verbal/tactile cues; suspect pt could be at risk for reduced abilitiy to meet hydration and nutrition needs. Per NSG, pt was able to swallow/clear meds crushed in puree earlier(Wife stated she has been crushing meds at home). Pt appears at increased risk for aspiration at this time d/t baseline Cognitive decline/Dementia and d/t oropharyngeal phase dysphagia presentation.     Aspiration Risk  Moderate aspiration risk    Diet Recommendation  Dys. 1 w/ Nectar liquids - via TSP at this time; strict aspiration precautions; feeding assistance  Medication Administration: Crushed with puree    Other  Recommendations Recommended Consults:  (Dietician) Oral Care Recommendations: Oral care BID;Staff/trained caregiver to provide oral care Other Recommendations: Order thickener from pharmacy;Prohibited food (jello, ice cream, thin soups);Remove water pitcher   Follow up Recommendations   (TBD)    Frequency and Duration min 3x week  2 weeks       Prognosis Prognosis for Safe Diet Advancement: Guarded (at this time) Barriers to Reach Goals: Cognitive deficits;Severity of deficits      Swallow Study   General Date of Onset: 08/30/15 HPI: Pt is a 75  y.o. male with a known history of essential hypertension, carotid artery disease, status post 2 stent placement, dementia, presented to the hospital with complaints of aspiration. Apparently patient was eating lunch some steak with gravy" and rice and started foaming and coughing. Patient's sister did not notice any Hx of food coughed up, but patient was severely uncomfortable and he was brought to emergency room for further evaluation where he was noted to be hypoxic and now requires 5 L of oxygen through nasal cannulas and his O2 sats ranging between 88-90%. Hospitalist services were contacted for admission. Patient is not able to give a review of systems since he is nonverbal due to advanced dementia. Wife stated she has to carefully monitor pt while eating d/t pt being impulsive and putting "too much in his mouth". Pt has been eating a fairly regular diet per Wife but requires monitoring (several years now) per her report. Pt is also nonverbal and doesn't follow verbal instructions(baseline per wife). Due to pt being more awake yesterday, MD initiated a dys. 1 w/ Honey liquids diet - wife and NSG reported pt has taken minimal amounts of either.  Type of Study: Bedside Swallow Evaluation Previous Swallow Assessment: none Diet Prior to this Study: Dysphagia 1 (puree);Honey-thick liquids (regular diet at home per report) Temperature Spikes Noted: No (wbc 11.6) Respiratory Status: Nasal cannula (2 liters) History of Recent Intubation: No Behavior/Cognition: Confused;Lethargic/Drowsy;Doesn't follow directions (nonverbal) Oral Cavity Assessment: Dry Oral Care Completed by SLP: Yes Oral Cavity - Dentition: Adequate natural dentition Vision:  (n/a) Self-Feeding Abilities: Total assist Patient Positioning: Upright in bed Baseline Vocal Quality:  (nonverbal) Volitional Cough: Cognitively unable to elicit Volitional Swallow: Unable to elicit    Oral/Motor/Sensory Function Overall Oral Motor/Sensory  Function:  (unable to formally assess sec. to Cognitive decline)   Ice Chips Other Comments: has been tolerating ice chip trials w/ wife and NSG w/ no reported overt s/s of aspiration   Thin Liquid Thin Liquid: Not tested    Nectar Thick Nectar Thick Liquid: Impaired Presentation: Spoon;Cup (fed; 8 tsp trials, 2 cup sip trials) Oral Phase Impairments:  (reduced oral awareness for bolus acceptance) Oral phase functional implications: Prolonged oral transit;Oral holding (x1 -2 toward end of trials) Pharyngeal Phase Impairments: Cough - Delayed;Suspected delayed Swallow (x1 on last trial) Other Comments: pt appeared to have fatigued and was not as engaged w/ the po trials as at the beginning of the session   Honey Thick Honey Thick Liquid: Impaired Presentation: Spoon (fed; x2 trials) Oral Phase Impairments: Other (comment) (reduced bolus awareness for bolus acceptance) Oral Phase Functional Implications: Prolonged oral transit Pharyngeal Phase Impairments: Suspected delayed Swallow (no coughing)   Puree Puree: Impaired Presentation: Spoon (fed; 5 trials) Oral Phase Impairments:  (decreased bolus awareness for acceptance) Oral Phase Functional Implications: Prolonged oral transit;Oral holding (diffuse oral residue and holding x1(last trial)) Pharyngeal Phase Impairments: Suspected delayed Swallow (no coughing noted)   Solid   GO   Solid: Not tested        Jerilynn Som, MS, CCC-SLP  Amarius Toto 09/03/2015,4:32 PM

## 2015-09-04 LAB — GLUCOSE, CAPILLARY
GLUCOSE-CAPILLARY: 170 mg/dL — AB (ref 65–99)
Glucose-Capillary: 178 mg/dL — ABNORMAL HIGH (ref 65–99)
Glucose-Capillary: 159 mg/dL — ABNORMAL HIGH (ref 65–99)

## 2015-09-04 MED ORDER — LABETALOL HCL 5 MG/ML IV SOLN
10.0000 mg | INTRAVENOUS | Status: DC | PRN
  Administered 2015-09-04 – 2015-09-05 (×2): 10 mg via INTRAVENOUS
  Filled 2015-09-04 (×2): qty 4

## 2015-09-04 MED ORDER — SODIUM CHLORIDE 0.9% FLUSH: 3.0000 mL | INTRAVENOUS | Status: DC | PRN

## 2015-09-04 NOTE — NC FL2 (Signed)
[place] MEDICAID FL2 LEVEL OF CARE SCREENING TOOL     IDENTIFICATION  Patient Name: Frank Lee Birthdate: 05/02/1941 Sex: male Admission Date (Current Location): 08/30/2015  [place] Number:  Chiropodist and Address:  [facility], [address] [place], [place] [address]      Provider Number: [id number]  Attending Physician Name and Address:  Altamese Dilling, MD  Relative Name and Phone Number:       Current Level of Care: SNF Recommended Level of Care: Skilled Nursing Facility Prior Approval Number:    Date Approved/Denied:   PASRR Number:  [id number])  Discharge Plan: SNF    Current Diagnoses: Patient Active Problem List   Diagnosis Date Noted  . Aspiration pneumonitis (HCC) 08/30/2015  . Acute respiratory failure with hypoxia (HCC) 08/30/2015  . Leukocytosis 08/30/2015  . Hypokalemia 08/30/2015  . Hyperglycemia 08/30/2015    Orientation RESPIRATION BLADDER Height & Weight      (Patient is disoriented )  Normal Incontinent Weight: 171 lb 1.6 oz (77.61 kg) Height:   (177.8 cm)  BEHAVIORAL SYMPTOMS/MOOD NEUROLOGICAL BOWEL NUTRITION STATUS   (None )  (None ) Incontinent Diet (DYS 1)  AMBULATORY STATUS COMMUNICATION OF NEEDS Skin   Extensive Assist Non-Verbally Normal                       Personal Care Assistance Level of Assistance  Bathing, Feeding, Dressing Bathing Assistance: Limited assistance Feeding assistance: Limited assistance Dressing Assistance: Limited assistance     Functional Limitations Info  Sight, Hearing, Speech Sight Info: Adequate Hearing Info: Adequate Speech Info: Adequate    SPECIAL CARE FACTORS FREQUENCY  PT (By licensed PT)     PT Frequency:  (5)              Contractures      Additional Factors Info  Code Status, Insulin Sliding Scale, Allergies Code Status Info:  (DNR ) Allergies Info:  (No Known Allergies )   Insulin Sliding Scale Info:   (insulin aspart (novoLOG) injection 0-9 Units 0-9 Units, Subcutaneous, 3 times daily with meals  & insulin aspart (novoLOG) injection 0-5 Units 0-5 Units, Subcutaneous, Daily at bedtime  )       Current Medications (09/04/2015):  This is the current hospital active medication list Current Facility-Administered Medications  Medication Dose Route Frequency Provider Last Rate Last Dose  . acetaminophen (TYLENOL) suppository 650 mg  650 mg Rectal Q4H PRN Milagros Loll, MD   650 mg at 09/01/15 2106  . acetaminophen (TYLENOL) tablet 650 mg  650 mg Oral Q4H PRN Enid Baas, MD   650 mg at 09/03/15 2306  . albuterol (PROVENTIL) (2.5 MG/3ML) 0.083% nebulizer solution 2.5 mg  2.5 mg Nebulization Q6H PRN Enid Baas, MD      . amLODipine (NORVASC) tablet 10 mg  10 mg Oral Daily Enid Baas, MD   10 mg at 09/03/15 0940  . Ampicillin-Sulbactam (UNASYN) 3 g in sodium chloride 0.9 % 100 mL IVPB  3 g Intravenous Q8H Katharina Caper, MD 100 mL/hr at 09/04/15 0935 3 g at 09/04/15 0935  . enoxaparin (LOVENOX) injection 40 mg  40 mg Subcutaneous Q24H Katharina Caper, MD   40 mg at 09/03/15 2246  . insulin aspart (novoLOG) injection 0-5 Units  0-5 Units Subcutaneous QHS Enid Baas, MD   0 Units at 08/31/15 2200  . insulin aspart (novoLOG) injection 0-9 Units  0-9 Units Subcutaneous TID WC Enid Baas, MD  2 Units at 09/04/15 0900  . labetalol (NORMODYNE,TRANDATE) injection 10 mg  10 mg Intravenous Q2H PRN Arnaldo Natal, MD   10 mg at 09/04/15 [phone]  . mirtazapine (REMERON) tablet 15 mg  15 mg Oral Q supper Oralia Manis, MD   15 mg at 09/03/15 1736  . nitroGLYCERIN (NITROGLYN) 2 % ointment 0.5 inch  0.5 inch Topical 4 times per day Katharina Caper, MD   0.5 inch at 09/04/15 0509  . ondansetron (ZOFRAN) tablet 4 mg  4 mg Oral Q6H PRN Katharina Caper, MD       Or  . ondansetron (ZOFRAN) injection 4 mg  4 mg Intravenous Q6H PRN Katharina Caper, MD      . sodium chloride flush (NS) 0.9 % injection  3 mL  3 mL Intravenous Q12H Katharina Caper, MD   3 mL at 09/04/15 1037     Discharge Medications: Please see discharge summary for a list of discharge medications.  Relevant Imaging Results:  Relevant Lab Results:   Additional Information  (SSN [id number])  Verta Ellen Sunkins, LCSW

## 2015-09-04 NOTE — Progress Notes (Signed)
Pt quite lethargic most of day. Rouses readily. Up to chair with p.t. Back to bed with 3 nurses assisting. bp 140/90. Taking food without choking. meds crushed with sauce. Tele nsr.

## 2015-09-04 NOTE — Progress Notes (Signed)
[facility] - [place] at [facility]   PATIENT NAME: Frank Lee    MR#:  [id number]  DATE OF BIRTH:  22-Sep-1940  SUBJECTIVE:  CHIEF COMPLAINT:   Chief Complaint  Patient presents with  . Aspiration   -  alert today, on room air. - CXR with bibasilar pneumonia, appreciated speech consult. - wife is in rom, as per her pt did not get out of bed for last 2 days.  REVIEW OF SYSTEMS:  Review of Systems  Unable to perform ROS: patient nonverbal    DRUG ALLERGIES:  No Known Allergies  VITALS:  Blood pressure 138/98, pulse 108, temperature 98.6 F (37 C), temperature source Oral, resp. rate 18, height  (1.778 m), weight 77.61 kg (171 lb 1.6 oz), SpO2 93 %.  PHYSICAL EXAMINATION:  Physical Exam  GENERAL:  75 y.o.-year-old patient lying in the bed with no acute distress.  EYES: Pupils equal, round, reactive to light and accommodation. No scleral icterus. Extraocular muscles intact.  HEENT: Head atraumatic, normocephalic. Oropharynx and nasopharynx clear.  NECK:  Supple, no jugular venous distention. No thyroid enlargement, no tenderness.  LUNGS: Normal breath sounds bilaterally, no wheezing, rales or crepitation. No use of accessory muscles of respiration.decreased bibasilar breath sounds Basilar coarse rhonchi worse on the right side CARDIOVASCULAR: S1, S2 normal. No murmurs, rubs, or gallops.  ABDOMEN: Soft, nontender, nondistended. Bowel sounds present. No organomegaly or mass.  EXTREMITIES: No pedal edema, cyanosis, or clubbing.  NEUROLOGIC: No facial droop noted. Patient is nonverbal and does not understand, so not following commands. PSYCHIATRIC: The patient is alert and nonverbal.  SKIN: No obvious rash, lesion, or ulcer.    LABORATORY PANEL:   CBC  Recent Labs Lab 09/03/15 0427  WBC 11.6*  HGB 14.8  HCT 43.2  PLT 195    ------------------------------------------------------------------------------------------------------------------  Chemistries   Recent Labs Lab 09/03/15 0427  NA 146*  K 3.5  CL 114*  CO2 26  GLUCOSE 176*  BUN 13  CREATININE 0.72  CALCIUM 9.3   ------------------------------------------------------------------------------------------------------------------  Cardiac Enzymes No results for input(s): TROPONINI in the last 168 hours. ------------------------------------------------------------------------------------------------------------------  RADIOLOGY:  Ct Head Wo Contrast  09/03/2015  CLINICAL DATA:  Right-sided weakness. EXAM: CT HEAD WITHOUT CONTRAST TECHNIQUE: Contiguous axial images were obtained from the base of the skull through the vertex without intravenous contrast. COMPARISON:  None. FINDINGS: Bony calvarium appears intact. Mild diffuse cortical atrophy is noted. Mild chronic ischemic white matter disease is noted. No mass effect or midline shift is noted. Ventricular size is within normal limits. There is no evidence of mass lesion, hemorrhage or acute infarction. IMPRESSION: Mild diffuse cortical atrophy. Mild chronic ischemic white matter disease. No acute intracranial abnormality seen. Electronically Signed   By: Lupita Raider, M.D.   On: 09/03/2015 15:50    EKG:   Orders placed or performed during the hospital encounter of 08/30/15  . ED EKG  . ED EKG  . EKG 12-Lead  . EKG 12-Lead    ASSESSMENT AND PLAN:   75 y/o M past medical history significant for primary progressive aphasic dementia, hypertension, coronary artery disease presents from home secondary to aspiration.  #1 acute hypoxic respiratory failure-secondary to an aspiration episode. -No history of previous aspiration. So likely one-time choking. -appreciated speech therapy eval- tolerating medications in applesauce . -CT of the chest right lower bronchus and possible pneumonia. -Continue  IV Unasyn-changed to Augmentin at discharge -Continue to wean off oxygen-on room air now.  #2 HTN-  started norvasc   #3 Dementia- nonverbal at baseline, does not understand or comprehend. No issues with eating usually Has caregivers at home. Ambulatory otherwise  appears very weak, so evaluate by PT.  suggested to go to SNF,, and PT saw again on request of wife- today.   Plan is to d/c to SNF tomorrow.  #4 DVT prophylaxis-Lovenox  #5 Hyperglycemia-no history of diabetes mellitus. Was on D5 drip- stopped.  Physical Therapy consulted Possible discharge tomorrow .   All the records are reviewed and case discussed with Care Management/Social Workerr. Management plans discussed with the patient, family and they are in agreement.  CODE STATUS: DNR  TOTAL TIME TAKING CARE OF THIS PATIENT: 35 minutes.   POSSIBLE D/C TOMORROW, DEPENDING ON CLINICAL CONDITION.   Altamese Dilling M.D on 09/04/2015 at 9:40 PM  Between 7am to 6pm - Pager - [phone]  After 6pm go to [email or web address] - password [email or web address] [facility]  [facility]  Office  [phone]  CC: Primary care physician; No primary care provider on file.

## 2015-09-04 NOTE — Care Management (Signed)
Physical therapy has recommended short term skilled nursing.  Spoke with wife and discussed anticipated discharge date is 2/15.

## 2015-09-04 NOTE — Progress Notes (Signed)
[facility] - [place] at [facility]   PATIENT NAME: Frank Lee    MR#:  [id number]  DATE OF BIRTH:  03/12/1941  SUBJECTIVE:  CHIEF COMPLAINT:   Chief Complaint  Patient presents with  . Aspiration   - more alert today, on room air. - CXR with bibasilar pneumonia, appreciated speech consult. - wife is in rom, as per her pt did not get out of bed for last 2 days.  REVIEW OF SYSTEMS:  Review of Systems  Unable to perform ROS: patient nonverbal    DRUG ALLERGIES:  No Known Allergies  VITALS:  Blood pressure 169/86, pulse 88, temperature 98.5 F (36.9 C), temperature source Oral, resp. rate 18, height  (1.778 m), weight 77.61 kg (171 lb 1.6 oz), SpO2 91 %.  PHYSICAL EXAMINATION:  Physical Exam  GENERAL:  75 y.o.-year-old patient lying in the bed with no acute distress.  EYES: Pupils equal, round, reactive to light and accommodation. No scleral icterus. Extraocular muscles intact.  HEENT: Head atraumatic, normocephalic. Oropharynx and nasopharynx clear.  NECK:  Supple, no jugular venous distention. No thyroid enlargement, no tenderness.  LUNGS: Normal breath sounds bilaterally, no wheezing, rales or crepitation. No use of accessory muscles of respiration.decreased bibasilar breath sounds Basilar coarse rhonchi worse on the right side CARDIOVASCULAR: S1, S2 normal. No murmurs, rubs, or gallops.  ABDOMEN: Soft, nontender, nondistended. Bowel sounds present. No organomegaly or mass.  EXTREMITIES: No pedal edema, cyanosis, or clubbing.  NEUROLOGIC: No facial droop noted. Patient is nonverbal and does not understand, so not following commands. PSYCHIATRIC: The patient is alert and nonverbal.  SKIN: No obvious rash, lesion, or ulcer.    LABORATORY PANEL:   CBC  Recent Labs Lab 09/03/15 0427  WBC 11.6*  HGB 14.8  HCT 43.2  PLT 195    ------------------------------------------------------------------------------------------------------------------  Chemistries   Recent Labs Lab 09/03/15 0427  NA 146*  K 3.5  CL 114*  CO2 26  GLUCOSE 176*  BUN 13  CREATININE 0.72  CALCIUM 9.3   ------------------------------------------------------------------------------------------------------------------  Cardiac Enzymes No results for input(s): TROPONINI in the last 168 hours. ------------------------------------------------------------------------------------------------------------------  RADIOLOGY:  Ct Head Wo Contrast  09/03/2015  CLINICAL DATA:  Right-sided weakness. EXAM: CT HEAD WITHOUT CONTRAST TECHNIQUE: Contiguous axial images were obtained from the base of the skull through the vertex without intravenous contrast. COMPARISON:  None. FINDINGS: Bony calvarium appears intact. Mild diffuse cortical atrophy is noted. Mild chronic ischemic white matter disease is noted. No mass effect or midline shift is noted. Ventricular size is within normal limits. There is no evidence of mass lesion, hemorrhage or acute infarction. IMPRESSION: Mild diffuse cortical atrophy. Mild chronic ischemic white matter disease. No acute intracranial abnormality seen. Electronically Signed   By: Lupita Raider, M.D.   On: 09/03/2015 15:50    EKG:   Orders placed or performed during the hospital encounter of 08/30/15  . ED EKG  . ED EKG  . EKG 12-Lead  . EKG 12-Lead    ASSESSMENT AND PLAN:   75 y/o M past medical history significant for primary progressive aphasic dementia, hypertension, coronary artery disease presents from home secondary to aspiration.  #1 acute hypoxic respiratory failure-secondary to an aspiration episode. -No history of previous aspiration. So likely one-time choking. -appreciated speech therapy eval- tolerating medications in applesauce . -CT of the chest right lower bronchus and possible pneumonia. -Continue  IV Unasyn-changed to Augmentin at discharge -Continue to wean off oxygen  #2 HTN- started norvasc   #  3 Dementia- nonverbal at baseline, does not understand or comprehend. No issues with eating usually At baseline Has caregivers at home. Ambulatory otherwise  appears very weak, so will re-evaluate by PT.  #4 DVT prophylaxis-Lovenox  #5 Hyperglycemia-no history of diabetes mellitus. Was on D5 drip- stopped.  Physical Therapy consulted Possible discharge tomorrow .   All the records are reviewed and case discussed with Care Management/Social Workerr. Management plans discussed with the patient, family and they are in agreement.  CODE STATUS: DNR  TOTAL TIME TAKING CARE OF THIS PATIENT: 35 minutes.   POSSIBLE D/C TOMORROW, DEPENDING ON CLINICAL CONDITION.   Altamese Dilling M.D on 09/04/2015 at 8:07 AM  Between 7am to 6pm - Pager - [phone]  After 6pm go to [email or web address] - password [email or web address] [facility]  [facility] [facility]  Office  [phone]  CC: Primary care physician; No primary care provider on file.

## 2015-09-04 NOTE — Progress Notes (Signed)
Initial Nutrition Assessment   INTERVENTION:   Meals and Snacks: Cater to patient preferences; SLP following Medical Food Supplement Therapy: will recommend Mighty Shakes on meal trays TID and Magic Cup TID for added nutrition (each supplement provides approximately 300kcals and 9g protein) Coordination of Care: pt may benefit from stronger bowel regimen as last documented BM on 2/9.   NUTRITION DIAGNOSIS:   Swallowing difficulty related to dysphagia as evidenced by  (diet order, SLP following).  GOAL:   Patient will meet greater than or equal to 90% of their needs  MONITOR:    (Energy Intake, Electrolyte and renal Profile, Anthropometrics, Digestive system)  REASON FOR ASSESSMENT:   Consult Poor PO  ASSESSMENT:   Pt admitted with SOB and acute respiratory failure, secondary to aspiration pna. Pt with h/o dementia and is nonverbal at baseline.  Past Medical History  Diagnosis Date  . Hypertension   . Dementia     Diet Order:  DIET - DYS 1 Room service appropriate?: Yes; Fluid consistency:: Nectar Thick    Current Nutrition: Per wife pt eating bites of meals and liquids. Pt wife reports pt needs reminding to swallow and sometimes needing ice chips to help get food down. Pt wife reports SLP working with pt daily. RD notes nectar thick juice by pt in windowsill with spoon in it. Wife reports when pt awakes he takes a few bites and then usually falls back asleep.   Food/Nutrition-Related History: Per wife, PTA pt had a great appetite and was eating 3 'full' meals per day. Per wife 'not just sandwiches.'    Scheduled Medications:  . amLODipine  10 mg Oral Daily  . ampicillin-sulbactam (UNASYN) IV  3 g Intravenous Q8H  . enoxaparin (LOVENOX) injection  40 mg Subcutaneous Q24H  . insulin aspart  0-5 Units Subcutaneous QHS  . insulin aspart  0-9 Units Subcutaneous TID WC  . mirtazapine  15 mg Oral Q supper  . nitroGLYCERIN  0.5 inch Topical 4 times per day  . sodium  chloride flush  3 mL Intravenous Q12H     Electrolyte/Renal Profile and Glucose Profile:   Recent Labs Lab 08/31/15 0516 09/01/15 0519 09/03/15 0427  NA 144 143 146*  K 3.3* 3.8 3.5  CL 109 110 114*  CO2 21* 23 26  BUN CREATININE 0.86 0.66 0.72  CALCIUM 9.4 9.4 9.3  GLUCOSE 248* 179* 176*   Protein Profile: No results for input(s): ALBUMIN in the last 168 hours.  Gastrointestinal Profile: Last BM:  08/30/2015   Nutrition-Focused Physical Exam Findings:  Unable to complete Nutrition-Focused physical exam at this time.    Weight Change: Per wife pt weight of 164lbs has been stable recently, on admission weight of 171lbs. Noted pt weight of 163lbs in November 2016.   Skin:  Reviewed, no issues   Height:   Ht Readings from Last 1 Encounters:  08/31/15  (1.778 m)    Weight:   Wt Readings from Last 1 Encounters:  08/31/15 171 lb 1.6 oz (77.61 kg)   Wt Readings from Last 10 Encounters:  08/31/15 171 lb 1.6 oz (77.61 kg)  06/08/15 163 lb (73.936 kg)    BMI:  Body mass index is 24.55 kg/(m^2).  Estimated Nutritional Needs:   Kcal:  BEE: 1512kcals, TEE: (IF 1.1-1.3)(AF 1.2) 1996-2360kcals  Protein:  78-93g protein (1.0-1.2g/kg)  Fluid:  1940-2331mL of fluid (25-24mL/kg)  EDUCATION NEEDS:   No education needs identified at this time   HIGH Care Level  Dwyane Luo, RD, LDN Pager [phone] Weekend/On-Call Pager [phone]

## 2015-09-04 NOTE — Progress Notes (Signed)
Pt BP was 175/95, RN applied nitro paste and rechecked in one hour. BP remained 175/102. MD Dr. Sheryle Hail notified. Orders given for prn IV labetalol. RN will administer and continue to monitor. Syliva Overman, RN

## 2015-09-04 NOTE — Care Management (Signed)
Spoke with Patient's husband.  Patient's head CT is negative.  Patient's wife is very firm in her statement that patient must be able to ambulate in order to return home.  She does have in home caregivers and would be very agreeable for home health nurse and physical therapy.  She would be open to consider short term skilled nursing if it is absolutely necessary but preference would be to go home.  Provided with list of home health agencies.  Has used [facility] in the past

## 2015-09-04 NOTE — Progress Notes (Signed)
Physical Therapy Treatment Patient Details Name: Frank Lee MRN: [id number] DOB: 03/11/1941 Today's Date: 09/04/2015    History of Present Illness Patient is a 75 y.o. male admitted on 09 Feb. after experiencing aspiration/ARF. PMH includes progressive dementia (non-verbal aphasic), essential HTN, CAD.    PT Comments    Pt extremely lethargic today; increased time/effort to awaken pt. Pt continues to be Max A x 2 for all mobility; however, decreased ability for stand and ambulation today requiring increased cueing and assist overall. Pt did receive up in chair, as pt was more awakened. Lengthy discussion with spouse regarding rehab versus home set up and increased assist/equipment she may need to care for pt at home. At this time, skilled nursing facility for rehab is recommendation in attempt to progress patients overall functional mobility. Spouse understands and will consider all options.   Follow Up Recommendations  SNF     Equipment Recommendations  None recommended by PT    Recommendations for Other Services       Precautions / Restrictions Precautions Precautions: Fall Restrictions Weight Bearing Restrictions: No    Mobility  Bed Mobility Overal bed mobility: Needs Assistance Bed Mobility: Supine to Sit     Supine to sit: Max assist;HOB elevated;+2 for physical assistance     General bed mobility comments: Increased assist required today due to lethargy and posterior lean  Transfers Overall transfer level: Needs assistance Equipment used: None Transfers: Sit to/from Stand Sit to Stand: Max assist;+2 physical assistance         General transfer comment: Pt retropulsive today; increased difficulty following gestures/tactile cues to stand. STS x 2 with Max A x 2 initially, Mod A 2nd attempt. Less right lean today, but increased retropulsion. Once in stand does so with Min HHA x 2  Ambulation/Gait Ambulation/Gait assistance: Min assist Ambulation Distance  (Feet): 3 Feet Assistive device: 2 person hand held assist   Gait velocity: decreased   General Gait Details: Increased instruction/effort to get pt to initiate stepping. pt does so for small steps bed to chair on second stand with Min A x 2   Stairs            Wheelchair Mobility    Modified Rankin (Stroke Patients Only)       Balance Overall balance assessment: Needs assistance Sitting-balance support: Bilateral upper extremity supported;Feet supported Sitting balance-Leahy Scale: Fair Sitting balance - Comments: Pushes into retropulsion Postural control: Posterior lean Standing balance support: Bilateral upper extremity supported Standing balance-Leahy Scale: Poor                      Cognition Arousal/Alertness: Lethargic (Increased time/effort to awaken for participation) Behavior During Therapy: Flat affect Overall Cognitive Status: History of cognitive impairments - at baseline                      Exercises      General Comments        Pertinent Vitals/Pain      Home Living                      Prior Function            PT Goals (current goals can now be found in the care plan section) Progress towards PT goals: Not progressing toward goals - comment    Frequency  Min 2X/week    PT Plan Current plan remains appropriate    Co-evaluation  End of Session Equipment Utilized During Treatment: Gait belt Activity Tolerance: Patient limited by lethargy (limited by cognition) Patient left: in chair;with family/visitor present (spouse does not wish alarm on chair)     Time: 0865-7846 PT Time Calculation (min) (ACUTE ONLY): 36 min  Charges:  $Gait Training: 8-22 mins $Therapeutic Activity: 8-22 mins                    G Codes:      Kristeen Miss 09/04/2015, 2:12 PM

## 2015-09-05 LAB — GLUCOSE, CAPILLARY
GLUCOSE-CAPILLARY: 173 mg/dL — AB (ref 65–99)
Glucose-Capillary: 173 mg/dL — ABNORMAL HIGH (ref 65–99)
Glucose-Capillary: 168 mg/dL — ABNORMAL HIGH (ref 65–99)

## 2015-09-05 MED ORDER — METOPROLOL TARTRATE 25 MG PO TABS: 25.0000 mg | ORAL_TABLET | Freq: Two times a day (BID) | ORAL | Status: AC

## 2015-09-05 MED ORDER — METOPROLOL TARTRATE 25 MG PO TABS
25.0000 mg | ORAL_TABLET | Freq: Two times a day (BID) | ORAL | Status: DC
  Administered 2015-09-05: 25 mg via ORAL
  Filled 2015-09-05: qty 1

## 2015-09-05 MED ORDER — AMOXICILLIN-POT CLAVULANATE 500-125 MG PO TABS: 1.0000 | ORAL_TABLET | Freq: Three times a day (TID) | ORAL | Status: AC

## 2015-09-05 NOTE — Care Management (Signed)
Patient's wife has decided to take patient home.  Agreeable to home health and agency of choice is [facility].  Orders present for SN PT OT Aide and SW.  Patient has in home care through [facility] and [facility].  Patient will require a hospital bed and obtained order from MD.  Referral sent to [facility].  Patient will need to transport home by EMS as he can not sit or stand.  Will need DNR signed.  Updated MD

## 2015-09-05 NOTE — Progress Notes (Signed)
New referral for [place] received from [facility]. Requested services are Nursing and Physical therapy, original orders included SW, aide and OT, discussed this with [facility] Nann and orders were amended. Patient is a 74 year old man with frontal temporal dementia admitted to [facility] on 2/9 for treatment of aspiration pneumonia. He has been treated with IV antibiotics and will be discharging today with oral antibiotics.Patient has also needed management for increased blood pressure. Per chart note review patient has had a significant functional decline since admission. He has been evaluated by physical therapy and SNF was recommended, patient's wife has declined this option and wishes for patient to return home with [facility] services. Hospital bed needed and has been ordered by [facility]. Writer met in the patient's room, [facility] services explained, brochure given. Patient's wife also asked questions about hospice, all questions answered. Patient remained asleep thorough out visit and did not awaken to voice or touch. His appetite remains poor, bites and sips. Medications given crushed in applesauce.  Patient information faxed to referral intake. Thank you for the opportunity to be involved in the care of this patient and his family. Flo Shanks RN, BSN, [place] [facility] liaison [phone] c

## 2015-09-05 NOTE — Discharge Instructions (Signed)
Activity as recommended by physical therapy Follow-up with primary care physician in a week-PCP to arrange outpatient palliative care follow-up in 1-2 weeks Diet-dysphagia type I with nectar thick liquids as tolerated

## 2015-09-05 NOTE — Progress Notes (Signed)
Pt to be discharged later this afternoon. Iv and tele removed. disch instructions and prescrips given . Wife will notify us when hospital bed is set up in the home. Pt will be transported by ems.

## 2015-09-05 NOTE — Clinical Social Work Placement (Addendum)
   CLINICAL SOCIAL WORK PLACEMENT  NOTE  Date:  09/05/2015  Patient Details  Name: Frank Lee MRN: [id number] Date of Birth: March 31, 1941  Clinical Social Work is seeking post-discharge placement for this patient at the Skilled  Nursing Facility level of care (*CSW will initial, date and re-position this form in  chart as items are completed):  Yes   Patient/family provided with [place] [facility] Clinical Social Work Department's list of facilities offering this level of care within the geographic area requested by the patient (or if unable, by the patient's family).  Yes   Patient/family informed of their freedom to choose among providers that offer the needed level of care, that participate in Medicare, Medicaid or managed care program needed by the patient, have an available bed and are willing to accept the patient.  Yes   Patient/family informed of [facility]'s ownership interest in [facility] and [facility], as well as of the fact that they are under no obligation to receive care at these facilities.  PASRR submitted to EDS on       PASRR number received on       Existing PASRR number confirmed on 09/05/15     FL2 transmitted to all facilities in geographic area requested by pt/family on 09/05/15     FL2 transmitted to all facilities within larger geographic area on       Patient informed that his/her managed care company has contracts with or will negotiate with certain facilities, including the following:            Patient/family informed of bed offers received.  Patient chooses bed at (Patient's wife declined SNF placement at this time. )     Physician recommends and patient chooses bed at       Patient to be transferred to   on  .  Patient to be transferred to facility by       Patient family notified on   of transfer.  Name of family member notified:        PHYSICIAN       Additional Comment:     _______________________________________________ Idamae Lusher, LCSW 09/05/2015, 8:52 AM

## 2015-09-05 NOTE — Discharge Summary (Signed)
[facility] [place] at [facility]   PATIENT NAME: Frank Lee    MR#:  [id number]  DATE OF BIRTH:  [birth year]  DATE OF ADMISSION:  08/30/2015 ADMITTING PHYSICIAN: Katharina Caper, MD  DATE OF DISCHARGE: 09/05/2015 PRIMARY CARE PHYSICIAN: No primary care provider on file.    ADMISSION DIAGNOSIS:  Aspiration pneumonitis (HCC) [J69.0] Aspiration into airway [T17.998A]  DISCHARGE DIAGNOSIS:  Principal Problem:   Acute respiratory failure with hypoxia (HCC) Active Problems:   Aspiration pneumonitis (HCC)   Leukocytosis   Hypokalemia   Hyperglycemia   SECONDARY DIAGNOSIS:   Past Medical History  Diagnosis Date  . Hypertension   . Dementia     HOSPITAL COURSE:  75 y/o M past medical history significant for primary progressive aphasic dementia, hypertension, coronary artery disease presents from home secondary to aspiration.  #1 acute hypoxic respiratory failure-secondary to an aspiration episode. -No history of previous aspiration. So likely one-time choking. -appreciated speech therapy eval- tolerating medications in applesauce . -CT of the chest right lower bronchus and possible pneumonia. -Continued  IV Unasyn during hospital course-changed to Augmentin at discharge -Continued to wean off oxygen-on room air now.  #2 HTN- started norvasc   #3 Dementia- nonverbal at baseline, does not understand or comprehend. No issues with eating usually Has caregivers at home. Ambulatory otherwise appears very weak,  evaluated by PT suggested to go to SNF, wife prefers taking him home with home health   #4 DVT prophylaxis-Lovenox  #5 Hyperglycemia-no history of diabetes mellitus. Was on D5 drip- stopped. Resolved  Discharge patient home to wife's care  DISCHARGE CONDITIONS:   fair  CONSULTS OBTAINED:  Treatment Team:  Yevonne Pax, MD Ramonita Lab, MD   PROCEDURES none  DRUG ALLERGIES:  No Known Allergies  DISCHARGE MEDICATIONS:    Current Discharge Medication List    START taking these medications   Details  amoxicillin-clavulanate (AUGMENTIN) 500-125 MG tablet Take 1 tablet (500 mg total) by mouth 3 (three) times daily. Qty: 15 tablet, Refills: 0    metoprolol tartrate (LOPRESSOR) 25 MG tablet Take 1 tablet (25 mg total) by mouth 2 (two) times daily. Qty: 60 tablet, Refills: 0      CONTINUE these medications which have NOT CHANGED   Details  simvastatin (ZOCOR) 20 MG tablet Take 20 mg by mouth daily.    amLODipine (NORVASC) 10 MG tablet Take 30 mg by mouth daily.    aspirin EC 325 MG tablet Take 325 mg by mouth daily.    hydrochlorothiazide (MICROZIDE) 12.5 MG capsule Take 12.5 mg by mouth daily.    mirtazapine (REMERON) 15 MG tablet Take 15 mg by mouth at bedtime.    naproxen (NAPROSYN) 250 MG tablet Take by mouth 2 (two) times daily with a meal.         DISCHARGE INSTRUCTIONS:   Activity as recommended by physical therapy Follow-up with primary care physician in a week-PCP to arrange outpatient palliative care follow-up in 1-2 weeks Diet-dysphagia type I with nectar thick liquids as tolerated   DIET:  Dysphagia type I with nectar thick liquids as tolerated  DISCHARGE CONDITION:  Fair  ACTIVITY:   As tolerated as recommended by physical therapy OXYGEN:  Home Oxygen: No.   Oxygen Delivery: room air  DISCHARGE LOCATION:  home   If you experience worsening of your admission symptoms, develop shortness of breath, life threatening emergency, suicidal or homicidal thoughts you must seek medical attention immediately by calling 911 or calling your MD immediately  if symptoms less severe.  You Must read complete instructions/literature along with all the possible adverse reactions/side effects for all the Medicines you take and that have been prescribed to you. Take any new Medicines after you have completely understood and accpet all the possible adverse reactions/side effects.   Please  note  You were cared for by a hospitalist during your hospital stay. If you have any questions about your discharge medications or the care you received while you were in the hospital after you are discharged, you can call the unit and asked to speak with the hospitalist on call if the hospitalist that took care of you is not available. Once you are discharged, your primary care physician will handle any further medical issues. Please note that NO REFILLS for any discharge medications will be authorized once you are discharged, as it is imperative that you return to your primary care physician (or establish a relationship with a primary care physician if you do not have one) for your aftercare needs so that they can reassess your need for medications and monitor your lab values.     Today  Chief Complaint  Patient presents with  . Aspiration   Patient is resting comfortably. No overnight events wife at bedside. Review of systems unobtainable because of the patient's baseline dementia, non verbal  VITAL SIGNS:  Blood pressure 159/99, pulse 98, temperature 98.4 F (36.9 C), temperature source Oral, resp. rate 18, height  (1.778 m), weight 69.945 kg (154 lb 3.2 oz), SpO2 91 %.  I/O:   Intake/Output Summary (Last 24 hours) at 09/05/15 1228 Last data filed at 09/05/15 0830  Gross per 24 hour  Intake    360 ml  Output      0 ml  Net    360 ml    PHYSICAL EXAMINATION:  GENERAL:  75 y.o.-year-old patient lying in the bed with no acute distress.  EYES: Pupils equal, round, reactive to light and accommodation. No scleral icterus. HEENT: Head atraumatic, normocephalic. Oropharynx and nasopharynx clear.  NECK:  Supple, no jugular venous distention. No thyroid enlargement, no tenderness.  LUNGS: Normal breath sounds bilaterally, no wheezing, rales,rhonchi or crepitation. No use of accessory muscles of respiration.  CARDIOVASCULAR: S1, S2 normal. No murmurs, rubs, or gallops.  ABDOMEN:  Soft, non-tender, non-distended. Bowel sounds present. No organomegaly or mass.  EXTREMITIES: No pedal edema, cyanosis, or clubbing.  NEUROLOGIC: could not check as pt with ams PSYCHIATRIC: The patient is  disoriented x 3.  SKIN: No obvious rash, lesion, or ulcer.   DATA REVIEW:   CBC  Recent Labs Lab 09/03/15 0427  WBC 11.6*  HGB 14.8  HCT 43.2  PLT 195    Chemistries   Recent Labs Lab 09/03/15 0427  NA 146*  K 3.5  CL 114*  CO2 26  GLUCOSE 176*  BUN 13  CREATININE 0.72  CALCIUM 9.3    Cardiac Enzymes No results for input(s): TROPONINI in the last 168 hours.  Microbiology Results  No results found for this or any previous visit.  RADIOLOGY:  Ct Head Wo Contrast  09/03/2015  CLINICAL DATA:  Right-sided weakness. EXAM: CT HEAD WITHOUT CONTRAST TECHNIQUE: Contiguous axial images were obtained from the base of the skull through the vertex without intravenous contrast. COMPARISON:  None. FINDINGS: Bony calvarium appears intact. Mild diffuse cortical atrophy is noted. Mild chronic ischemic white matter disease is noted. No mass effect or midline shift is noted. Ventricular size is within normal limits. There is  no evidence of mass lesion, hemorrhage or acute infarction. IMPRESSION: Mild diffuse cortical atrophy. Mild chronic ischemic white matter disease. No acute intracranial abnormality seen. Electronically Signed   By: Lupita Raider, M.D.   On: 09/03/2015 15:50    EKG:   Orders placed or performed during the hospital encounter of 08/30/15  . ED EKG  . ED EKG  . EKG 12-Lead  . EKG 12-Lead      Management plans discussed with the patient, family and they are in agreement.  CODE STATUS:     Code Status Orders        Start     Ordered   08/31/15 1535  Do not attempt resuscitation (DNR)   Continuous    Question Answer Comment  In the event of cardiac or respiratory ARREST Do not call a "code blue"   In the event of cardiac or respiratory ARREST Do not  perform Intubation, CPR, defibrillation or ACLS   In the event of cardiac or respiratory ARREST Use medication by any route, position, wound care, and other measures to relive pain and suffering. May use oxygen, suction and manual treatment of airway obstruction as needed for comfort.      08/31/15 1534    Code Status History    Date Active Date Inactive Code Status Order ID Comments User Context   08/30/2015  5:56 PM 08/31/2015  3:34 PM Full Code [id number]  Katharina Caper, MD ED    Advance Directive Documentation        Most Recent Value   Type of Advance Directive  Healthcare Power of Attorney   Pre-existing out of facility DNR order (yellow form or pink MOST form)     "MOST" Form in Place?        TOTAL TIME TAKING CARE OF THIS PATIENT: 45  minutes.    @[facility] @  on 09/05/2015 at 12:28 PM  Between 7am to 6pm - Pager - [phone]  After 6pm go to [email or web address] - password [email or web address] [facility]  [place]  Office  [phone]  CC: Primary care physician; No primary care provider on file.

## 2015-09-05 NOTE — Care Management Important Message (Signed)
Important Message  Patient Details  Name: Frank Lee MRN: [id number] Date of Birth: 1941/04/18   Medicare Important Message Given:  Yes    Olegario Messier A Genesis Paget 09/05/2015, 9:44 AM

## 2015-09-05 NOTE — Clinical Social Work Note (Signed)
Clinical Social Work Assessment  Patient Details  Name: Frank Lee MRN: [id number] Date of Birth: 1941-02-17  Date of referral:  09/04/15               Reason for consult:  Discharge Planning                Permission sought to share information with:  Family Supports Permission granted to share information::  Yes, Verbal Permission Granted  Name::        Agency::     Relationship::   Frank Lee - Wife [phone]))  Contact Information:     Housing/Transportation Living arrangements for the past 2 months:  Single Family Home Source of Information:  Spouse Patient Interpreter Needed:  None Criminal Activity/Legal Involvement Pertinent to Current Situation/Hospitalization:  No - Comment as needed Significant Relationships:  Spouse Frank Lee - Wife [phone])) Lives with:  Spouse Frank Lee - Wife [phone])) Do you feel safe going back to the place where you live?  Yes Need for family participation in patient care:  Yes (Comment) Frank Lee - Wife [phone]))  Care giving concerns:  Patient is not at baseline per his wife. She reports that he was able to walk before hospitalization.    Social Worker assessment / plan:  09/04/15 @3 :50 PM: CSW was informed by PT that patient is recommended for SNF placement. CSW met with patient and his wife. Patient was sitting up in chair. CSW introduced role to patient and his wife. Patient's wife reports that PT explained that patient needs SNF placement at discharge. Patient' wife reports that she does not feel comfortable with patient going to SNF. She granted CSW verbal permission to send referral to SNFs in [facility]. CSW provided patient's wife with at list of SNFs in [place]. CSW encouraged wife to think about her decision for SNF vs HH over night. Reports her preference would be [facility] or [facility]  FL2/ PASRR completed and faxed to SNFs in [place]. Awaiting bed offers.   4:00 PM  CSW called patient's wife and presented bed offers. Patient's wife reports that she still has not made a decision but appreciated CSW working diligently on obtaining bed offers. Reports that she'll have a decision made about discharge plans tomorrow morning (09/05/15).  09/05/15: CSW presented bed offers to patient and his wife this morning. Per patient's wife "my intuition is telling me to take him home... He needs to be with me...". Patient's wife thanked CSW for her assistance. CSW informed RN Case Manager of above. CSW is signing off. CSW is available if a need were to arise.   Employment status:  Retired Forensic scientist:  Medicare PT Recommendations:  [place] / Referral to community resources:  [place]  Patient/Family's Response to care:  Patient's wife was unsure if she'd want to allow patient to go to SNF due to his "condition".   Patient/Family's Understanding of and Emotional Response to Diagnosis, Current Treatment, and Prognosis:  Patient's wife understands CSW's role and is appreciative for assistance.   Emotional Assessment Appearance:  Appears stated age Attitude/Demeanor/Rapport:  Lethargic Affect (typically observed):  Calm, Pleasant Orientation:   (Patient is disoriented ) Alcohol / Substance use:  Not Applicable Psych involvement (Current and /or in the community):  No (Comment)  Discharge Needs  Concerns to be addressed:  Discharge Planning Concerns Readmission within the last 30 days:  No Current discharge risk:  Chronically ill Barriers to Discharge:  Continued Medical Work up   [employer], LCSW 09/05/2015, 8:39 AM

## 2015-09-05 NOTE — Care Management (Addendum)
HOSPITAL BED  Problems with Aspiration Patient spends   75 % of the time in bed. Advancing dementia with dysphagia puts patient at constant risk for aspiration.  Torso required to be elevated at least 30 degrees  to prevent gagging and choking and decrease risk of aspiration  Bed wedges do not provide adequate elevation to resolve issues with aspiration.  Patient requires frequent repositioning that a normal bed is unable to provide

## 2015-09-19 DEATH — deceased

## 2016-12-26 IMAGING — CT CT CHEST W/O CM
2 of 3 series · 16 of 46 positions shown, 18 images · non-contrast
Comparison: Current chest radiograph

CLINICAL DATA: Patient reportedly aspirated eating lunch today
followed by coughing. Patient hypoxic on admission to the emergency
department.

EXAM:
CT CHEST WITHOUT CONTRAST
TECHNIQUE: Multidetector CT imaging of the chest was performed following the
standard protocol without IV contrast.

[Series 2: routine chest wo · axial · 0.70mm/px · z∈[-722,-422]mm · 13 of 70 slices shown, 15 images]
[im 5/70  soft-tissue]
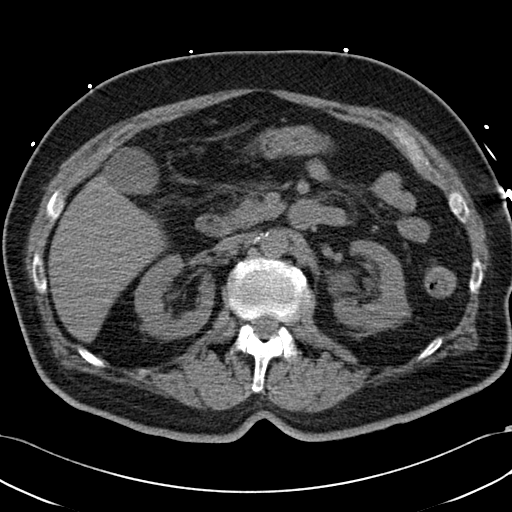
[im 5/70  bone]
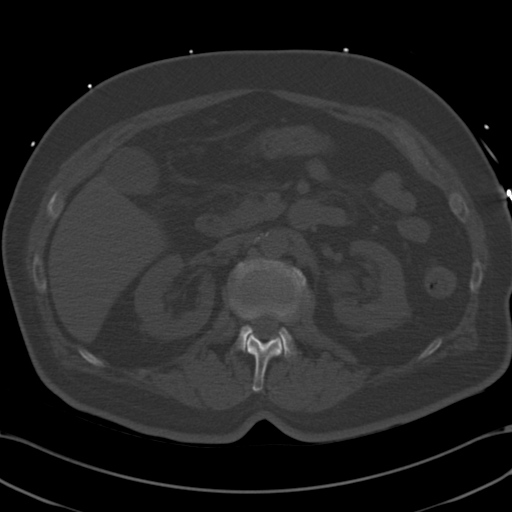
[im 9/70  soft-tissue]
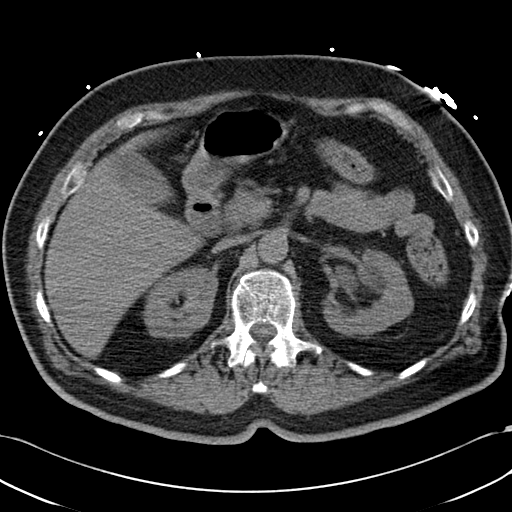
[im 14/70  soft-tissue]
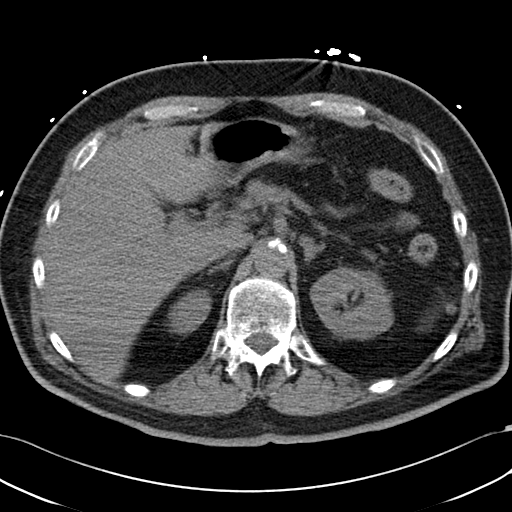
[im 21/70  soft-tissue]
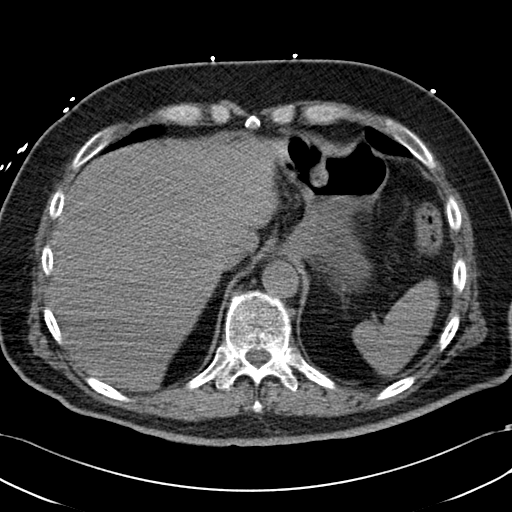
[im 25/70  soft-tissue]
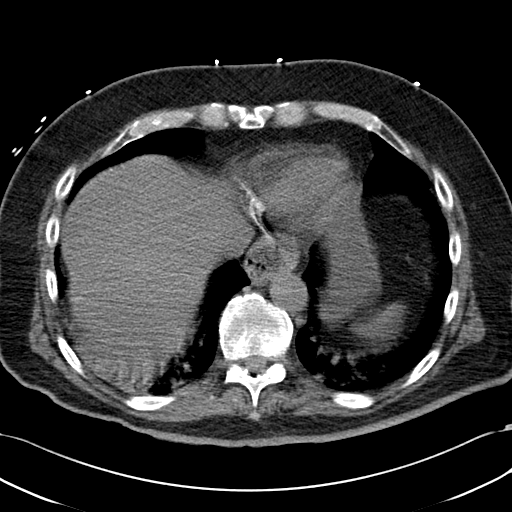
[im 29/70  soft-tissue]
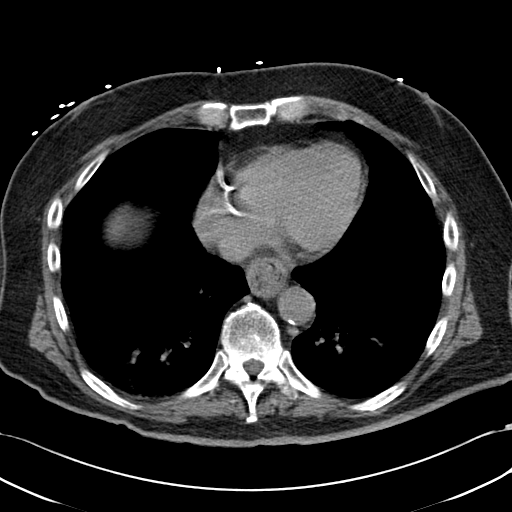
[im 36/70  soft-tissue]
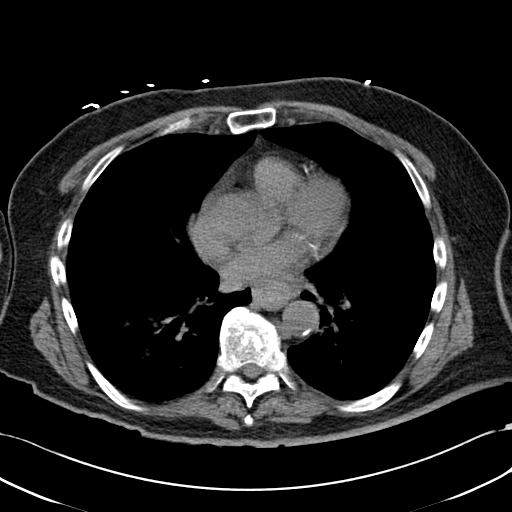
[im 41/70  soft-tissue]
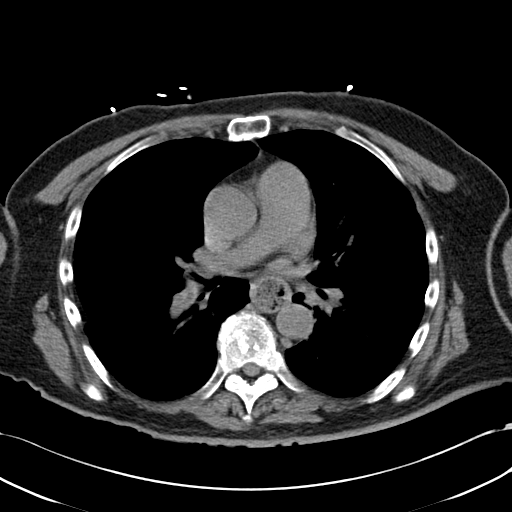
[im 45/70  soft-tissue]
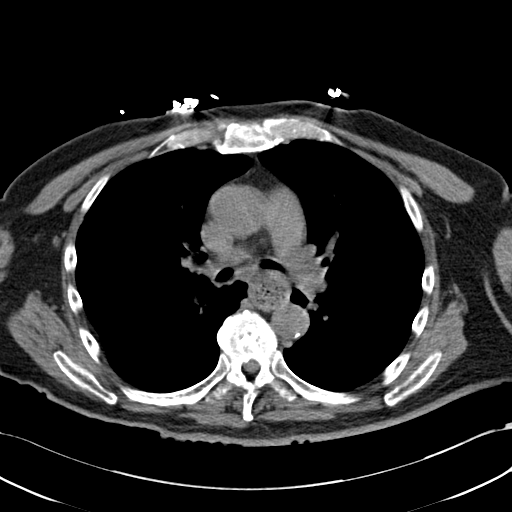
[im 45/70  bone]
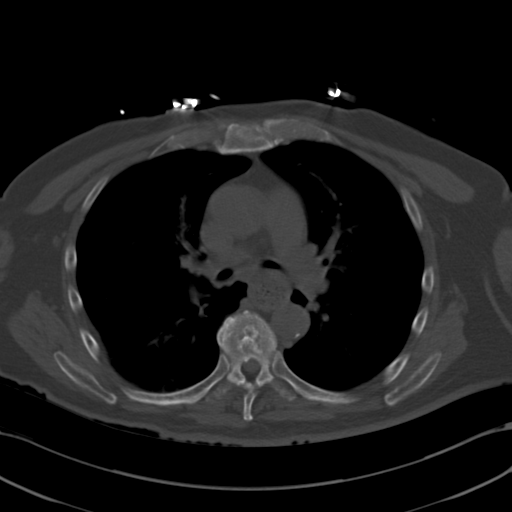
[im 49/70  soft-tissue]
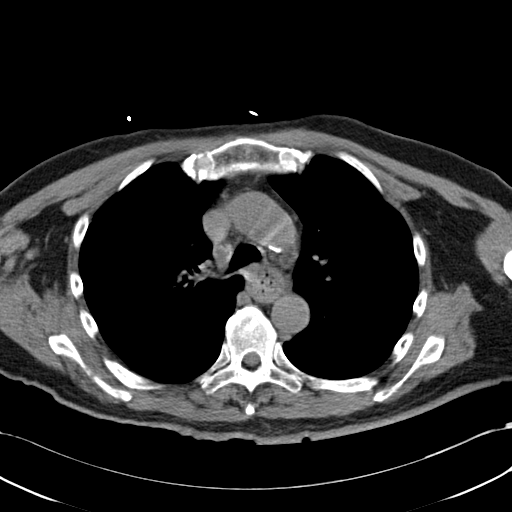
[im 56/70  soft-tissue]
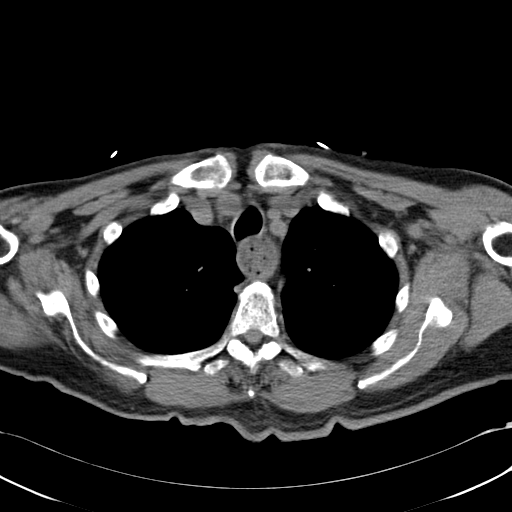
[im 61/70  soft-tissue]
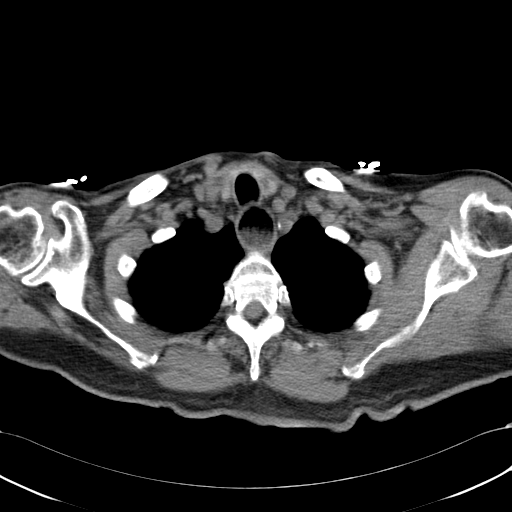
[im 65/70  soft-tissue]
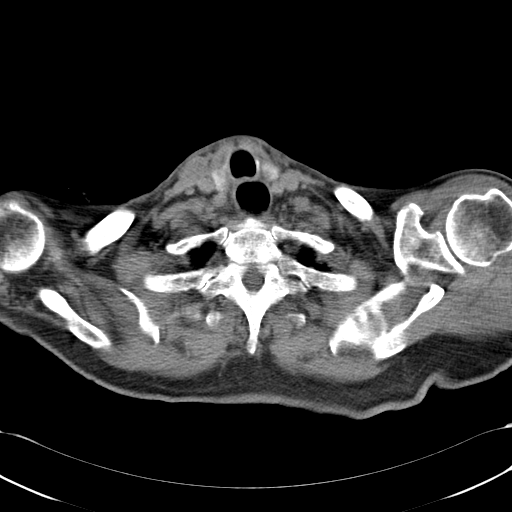

[Series 5: routine chest wo cor · coronal · 0.70mm/px · 3 of 164 slices shown]
[im 55/164  soft-tissue]
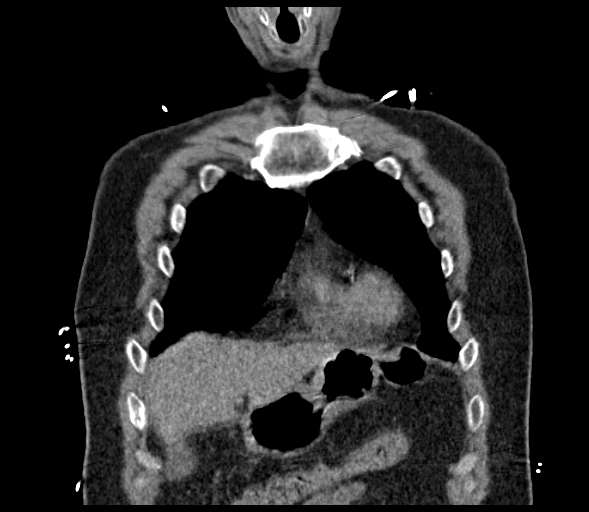
[im 73/164  soft-tissue]
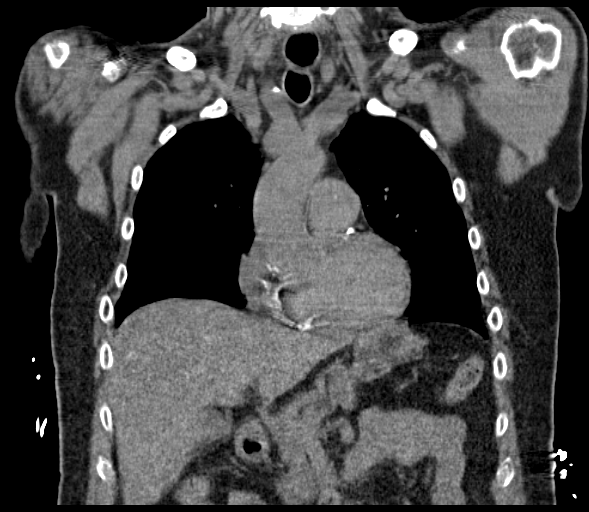
[im 91/164  soft-tissue]
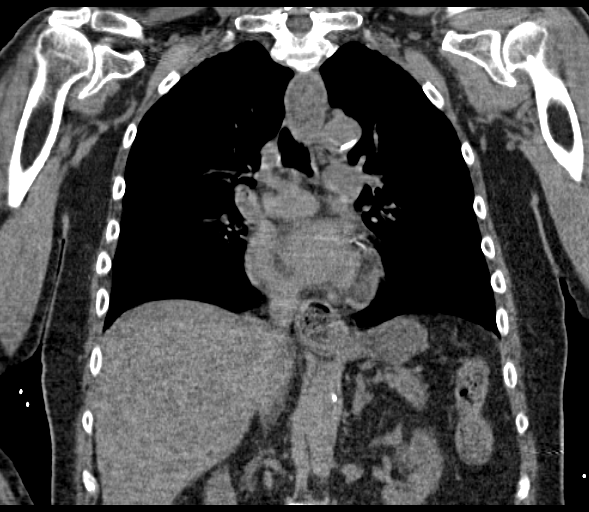

[16 of 46 positions shown; findings below may reference images not displayed]

FINDINGS: Neck base and axilla: No mass or adenopathy. Thyroid is normal in
size but heterogeneous which may be from multiple small nodules.

Mediastinum and hila: Heart normal in size and configuration. There
are dense coronary artery calcifications. There is mild dilation of
the main pulmonary artery to 3.5 cm. The esophagus is distended
throughout its entirety containing nonopaque through dense small
bubbles of air. Esophagus is distended containing prudent small
bubbles of air. There is no convincing esophageal mass. The debris
in the esophagus extends to the upper thoracic esophagus. No
mediastinal or hilar masses or enlarged lymph nodes.

Lungs and pleura: There is lower lobe short segment bronchial
impaction. There is surrounding reticular and peribronchovascular
airspace opacity which is predominantly ground-glass. This
predominates in the lower lobes is also evident in the right middle
lobe. There is no pulmonary edema. No pleural effusion or
pneumothorax.

Limited upper abdomen:  No acute findings.

Musculoskeletal: Mild degenerative changes throughout the thoracic
spine. No osteoblastic or osteolytic lesions.
IMPRESSION: 1. Findings are consistent with the history of aspiration with lower
lobe bronchial impaction surrounded by reticular and airspace
opacities. Similar but less prominent abnormalities are noted in the
right middle lobe.
2. The esophagus is distended with debris and air. No CT evidence of
esophageal mass/ obstruction. Findings may be from esophageal
achalasia. Distal stricture is possible. Consider follow-up
nonemergent esophagram or endoscopy.

## 2016-12-30 IMAGING — CT CT HEAD W/O CM
1 series · 16 of 30 positions shown, 20 images · non-contrast
Comparison: None.

CLINICAL DATA: Right-sided weakness.

EXAM:
CT HEAD WITHOUT CONTRAST
TECHNIQUE: Contiguous axial images were obtained from the base of the skull
through the vertex without intravenous contrast.

[Series 2: head wo · axial · 0.47mm/px · z∈[-164,-24]mm · 16 of 32 slices shown, 20 images]
[im 2/32  brain]
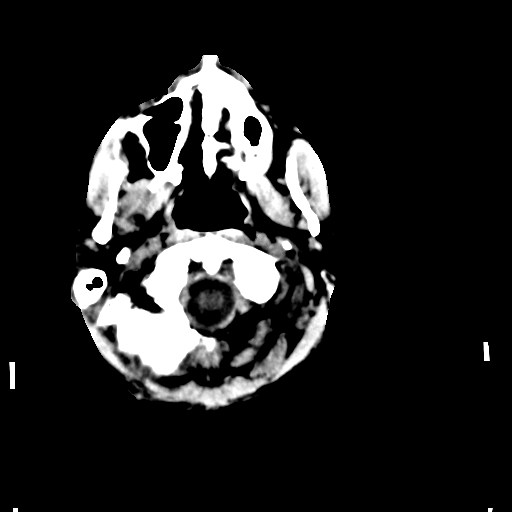
[im 2/32  bone]
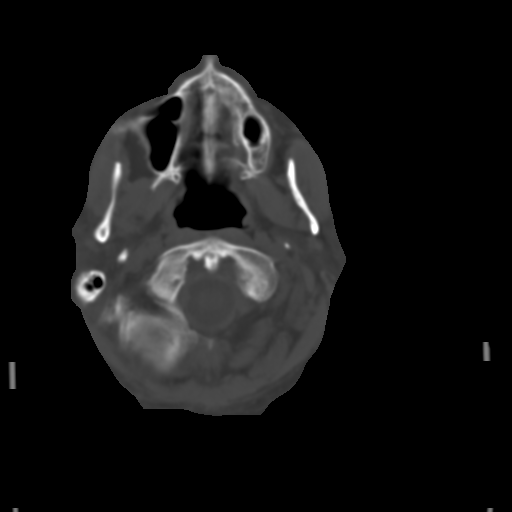
[im 4/32  brain]
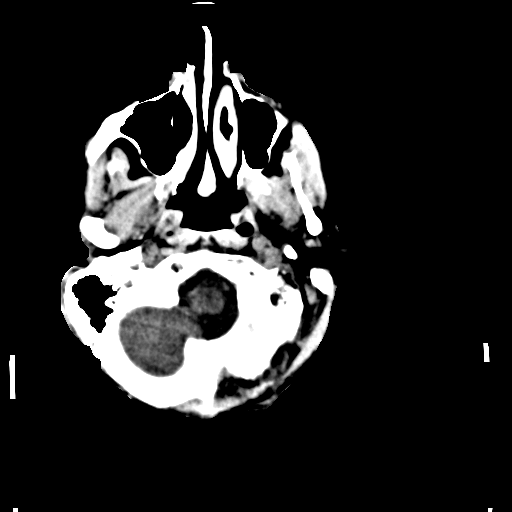
[im 6/32  brain]
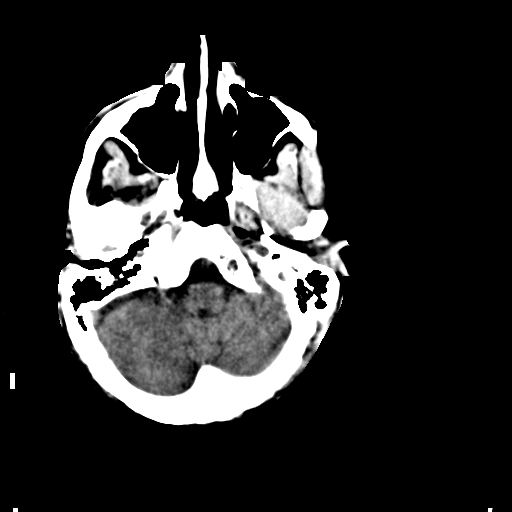
[im 8/32  brain]
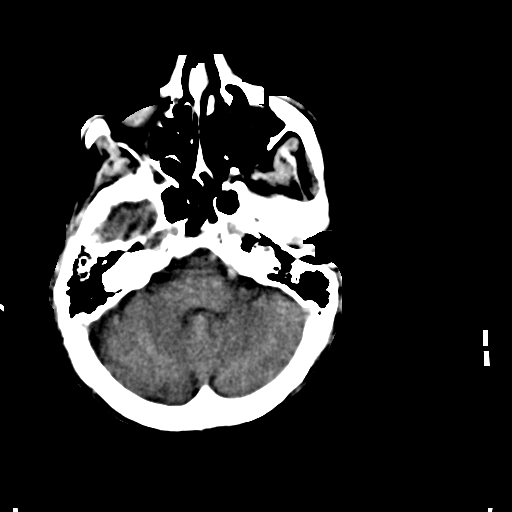
[im 9/32  brain]
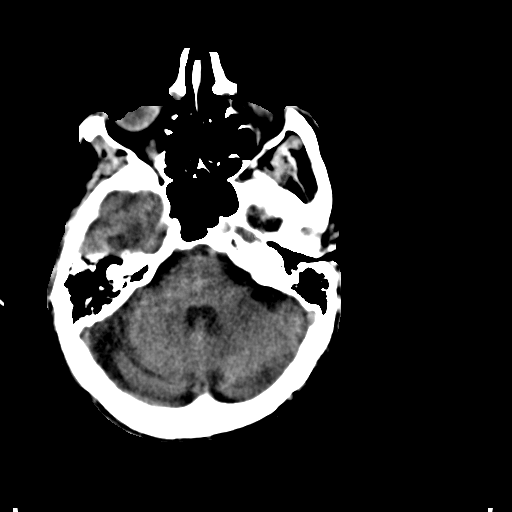
[im 9/32  bone]
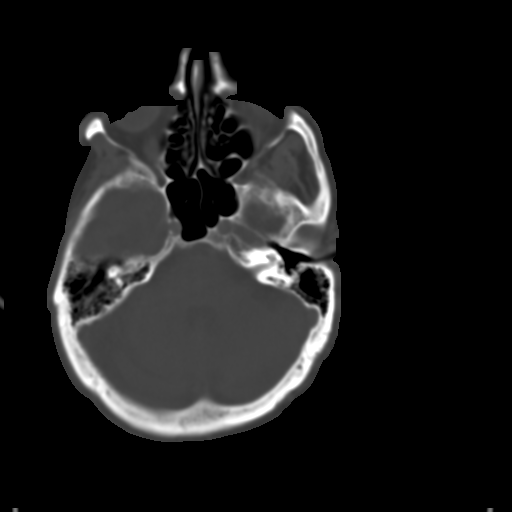
[im 11/32  brain]
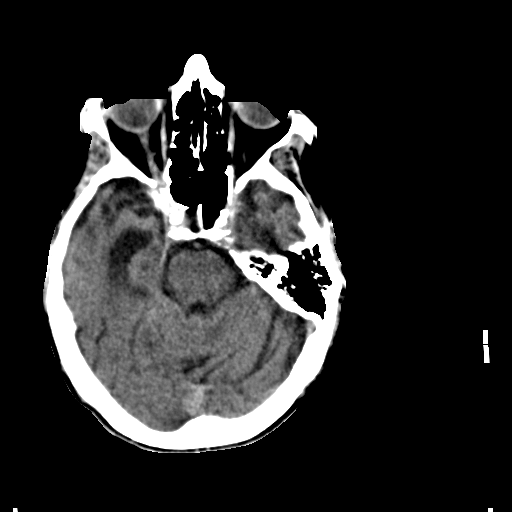
[im 13/32  brain]
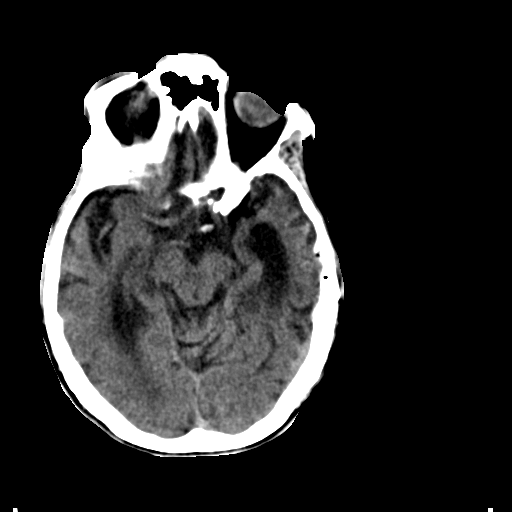
[im 15/32  brain]
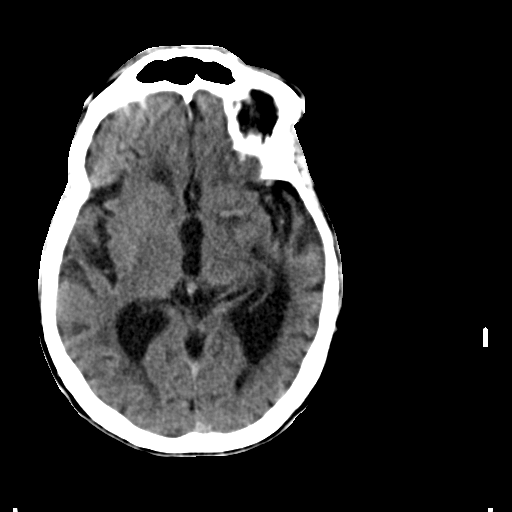
[im 17/32  brain]
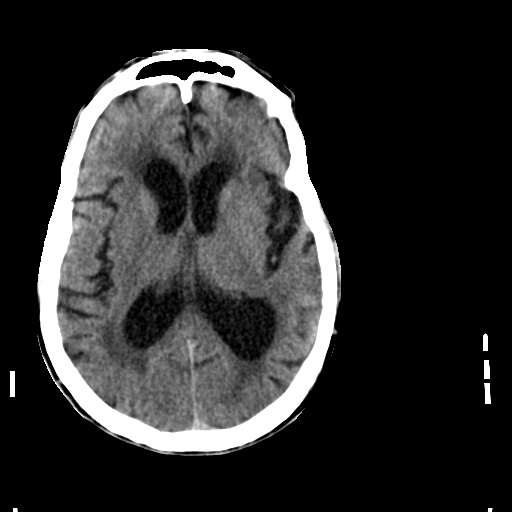
[im 17/32  bone]
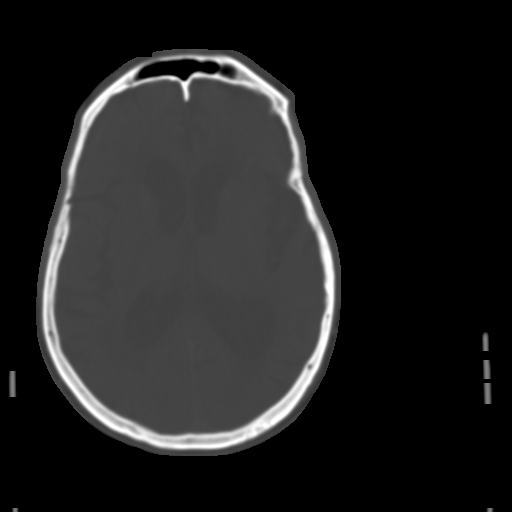
[im 19/32  brain]
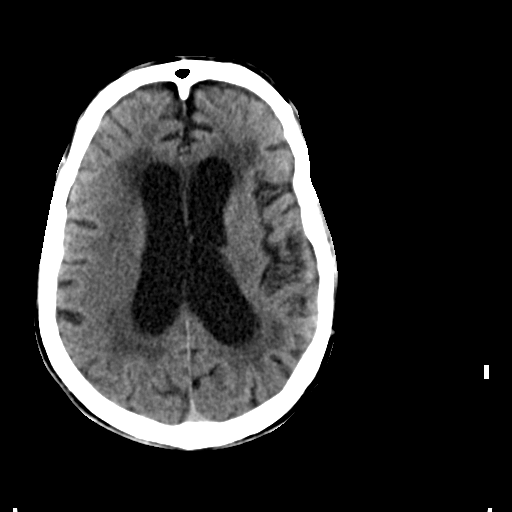
[im 21/32  brain]
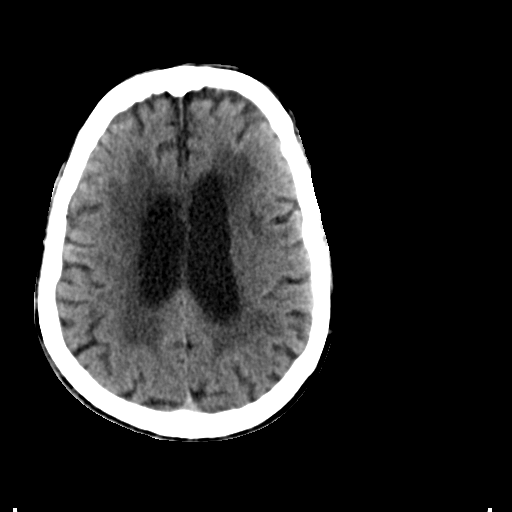
[im 23/32  brain]
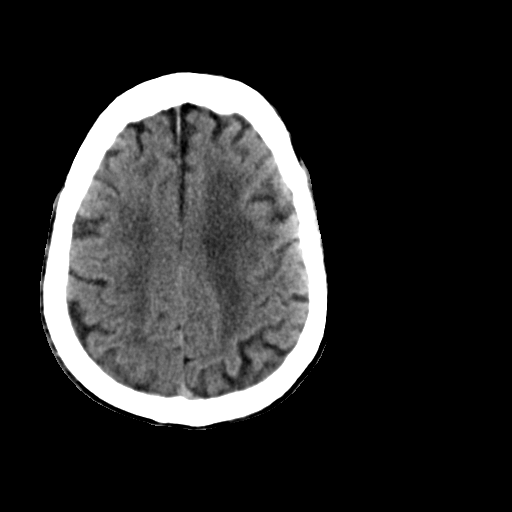
[im 24/32  brain]
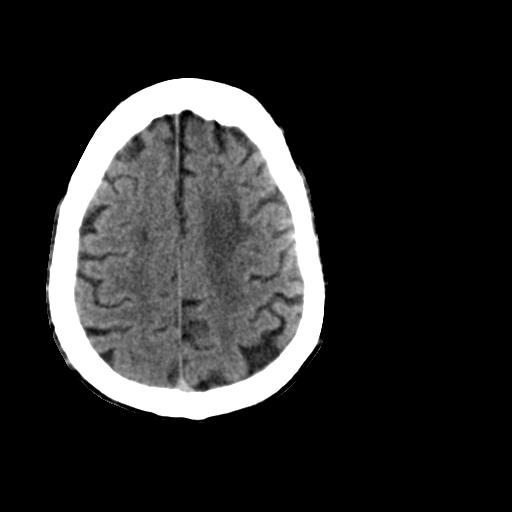
[im 24/32  bone]
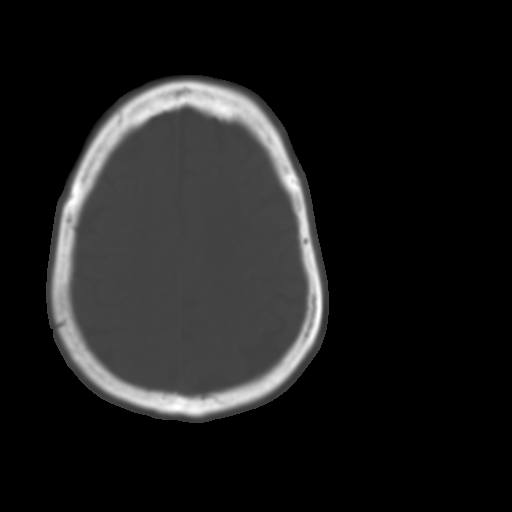
[im 26/32  brain]
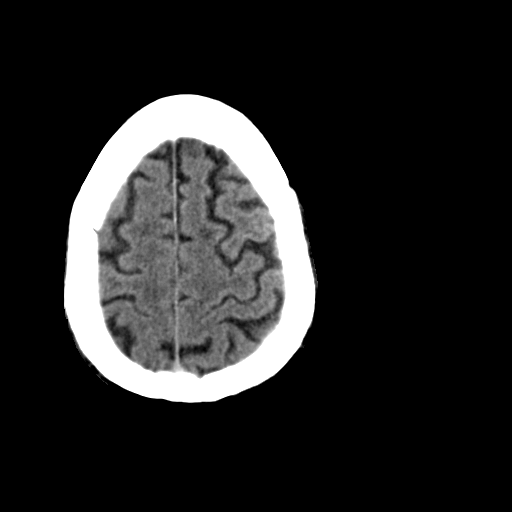
[im 28/32  brain]
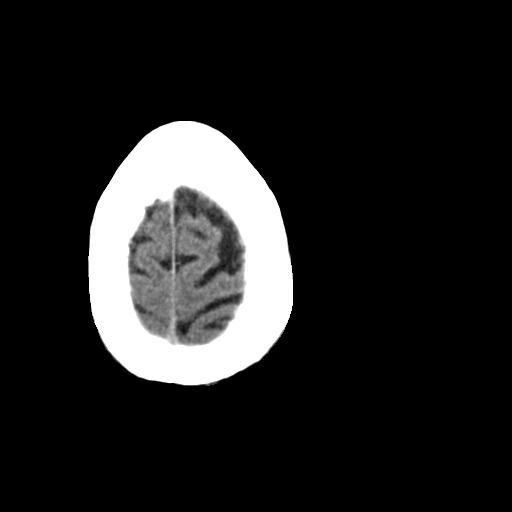
[im 30/32  brain]
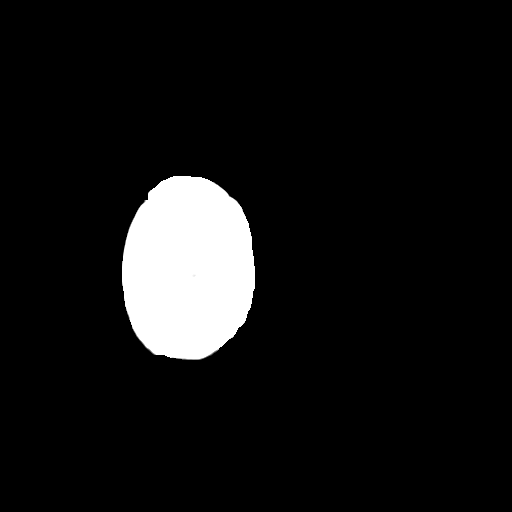

[16 of 30 positions shown; findings below may reference images not displayed]

FINDINGS: Bony calvarium appears intact. Mild diffuse cortical atrophy is
noted. Mild chronic ischemic white matter disease is noted. No mass
effect or midline shift is noted. Ventricular size is within normal
limits. There is no evidence of mass lesion, hemorrhage or acute
infarction.
IMPRESSION: Mild diffuse cortical atrophy. Mild chronic ischemic white matter
disease. No acute intracranial abnormality seen.
